# Patient Record
Sex: Female | Born: 1960 | Race: White | Hispanic: No | Marital: Married | Smoking: Never smoker
Health system: Southern US, Community
[De-identification: ages and names within clinical notes are randomized; demographics above are authoritative.]

## PROBLEM LIST (undated history)

## (undated) DIAGNOSIS — Z9889 Other specified postprocedural states: Secondary | ICD-10-CM

## (undated) DIAGNOSIS — F329 Major depressive disorder, single episode, unspecified: Secondary | ICD-10-CM

## (undated) DIAGNOSIS — E7800 Pure hypercholesterolemia, unspecified: Secondary | ICD-10-CM

## (undated) DIAGNOSIS — K219 Gastro-esophageal reflux disease without esophagitis: Secondary | ICD-10-CM

## (undated) DIAGNOSIS — R51 Headache: Secondary | ICD-10-CM

## (undated) DIAGNOSIS — E039 Hypothyroidism, unspecified: Secondary | ICD-10-CM

## (undated) DIAGNOSIS — F32A Depression, unspecified: Secondary | ICD-10-CM

## (undated) DIAGNOSIS — D649 Anemia, unspecified: Secondary | ICD-10-CM

## (undated) DIAGNOSIS — F419 Anxiety disorder, unspecified: Secondary | ICD-10-CM

## (undated) DIAGNOSIS — R112 Nausea with vomiting, unspecified: Secondary | ICD-10-CM

## (undated) HISTORY — PX: COLONOSCOPY: SHX174

## (undated) SURGERY — HYSTERECTOMY, ABDOMINAL
Anesthesia: General

---

## 1996-01-02 HISTORY — PX: OTHER SURGICAL HISTORY: SHX169

## 1997-05-23 ENCOUNTER — Other Ambulatory Visit: Admission: RE | Admit: 1997-05-23 | Discharge: 1997-05-23 | Payer: Self-pay | Admitting: Surgery

## 1997-06-14 ENCOUNTER — Ambulatory Visit (HOSPITAL_BASED_OUTPATIENT_CLINIC_OR_DEPARTMENT_OTHER): Admission: RE | Admit: 1997-06-14 | Discharge: 1997-06-14 | Payer: Self-pay | Admitting: Surgery

## 2005-01-01 HISTORY — PX: HEMORROIDECTOMY: SUR656

## 2005-03-19 ENCOUNTER — Ambulatory Visit (HOSPITAL_BASED_OUTPATIENT_CLINIC_OR_DEPARTMENT_OTHER): Admission: RE | Admit: 2005-03-19 | Discharge: 2005-03-19 | Payer: Self-pay | Admitting: Surgery

## 2010-07-13 ENCOUNTER — Other Ambulatory Visit (HOSPITAL_COMMUNITY)
Admission: RE | Admit: 2010-07-13 | Discharge: 2010-07-13 | Disposition: A | Discharge status: Home / Self Care | Payer: BC Managed Care – PPO | Source: Ambulatory Visit | Attending: Obstetrics and Gynecology | Admitting: Obstetrics and Gynecology

## 2010-07-13 ENCOUNTER — Other Ambulatory Visit: Payer: Self-pay | Admitting: Obstetrics and Gynecology

## 2010-07-13 DIAGNOSIS — Z1159 Encounter for screening for other viral diseases: Secondary | ICD-10-CM | POA: Insufficient documentation

## 2010-07-13 DIAGNOSIS — Z01419 Encounter for gynecological examination (general) (routine) without abnormal findings: Secondary | ICD-10-CM | POA: Insufficient documentation

## 2010-09-13 ENCOUNTER — Encounter (HOSPITAL_COMMUNITY): Payer: Self-pay

## 2010-09-13 ENCOUNTER — Encounter (HOSPITAL_COMMUNITY)
Admission: RE | Admit: 2010-09-13 | Discharge: 2010-09-13 | Disposition: A | Discharge status: Home / Self Care | Payer: BC Managed Care – PPO | Source: Ambulatory Visit | Attending: Obstetrics and Gynecology | Admitting: Obstetrics and Gynecology

## 2010-09-13 HISTORY — DX: Major depressive disorder, single episode, unspecified: F32.9

## 2010-09-13 HISTORY — DX: Hypothyroidism, unspecified: E03.9

## 2010-09-13 HISTORY — DX: Pure hypercholesterolemia, unspecified: E78.00

## 2010-09-13 HISTORY — DX: Gastro-esophageal reflux disease without esophagitis: K21.9

## 2010-09-13 HISTORY — DX: Anemia, unspecified: D64.9

## 2010-09-13 HISTORY — DX: Depression, unspecified: F32.A

## 2010-09-13 HISTORY — DX: Other specified postprocedural states: Z98.890

## 2010-09-13 HISTORY — DX: Anxiety disorder, unspecified: F41.9

## 2010-09-13 HISTORY — DX: Headache: R51

## 2010-09-13 HISTORY — DX: Nausea with vomiting, unspecified: R11.2

## 2010-09-13 LAB — CBC
HCT: 39.3 % (ref 36.0–46.0)
MCHC: 33.8 g/dL (ref 30.0–36.0)
Platelets: 348 10*3/uL (ref 150–400)
RDW: 14.2 % (ref 11.5–15.5)
WBC: 6.1 10*3/uL (ref 4.0–10.5)

## 2010-09-13 LAB — SURGICAL PCR SCREEN: Staphylococcus aureus: POSITIVE — AB

## 2010-09-13 NOTE — Pre-Procedure Instructions (Signed)
Anesthesia will see dos-update given to Dr F Jackson-LSD ok 

## 2010-09-13 NOTE — Patient Instructions (Addendum)
   Your procedure is scheduled WU:[id number] 09/18/2010  Enter through the Main Entrance of [facility] at 6am Pick up the phone at the desk and dial [phone]  Please call this number if you have any problems the morning of surgery: [phone]  Remember: Do not eat food after midnight  Do not drink clear liquids after:midnight Take these medicines the morning of surgery with a SIP OF WATER:prilosec, wellbutrin and zoloft  Do not wear jewelry, make-up, or FINGER nail polish Do not wear lotions, powders, or perfumes. Do not shave 48 hours prior to surgery. Do not bring valuables to the hospital. Leave suitcase in the car. After Surgery it may be brought to your room. For patients being admitted to the hospital, checkout time is 11:00am the day of discharge.  Use hibiclens as instructed

## 2010-09-17 ENCOUNTER — Other Ambulatory Visit: Payer: Self-pay | Admitting: Obstetrics and Gynecology

## 2010-09-17 NOTE — H&P (Signed)
Mikayla Wheeler is an 50 y.o. female.G0 with worsening menorrhagia, dysmenorrhea due to fibroids.  [place] confirms multiple firbroids with growth and a left ovarian mass consistent with endometriosis. Pt requests TAHBSO.  Informed consent given for these procedures.  Pertinent Gynecological History: Menses: flow is excessive with use of multiple pads or tampons on heaviest days Bleeding: clottling present Contraception: none DES exposure: denies Blood transfusions: none Sexually transmitted diseases: no past history Previous GYN Procedures: endometrial biopsy negative less than a year.  Last mammogram: normal Date: 07/14/10 Last pap: normal Date: 07/13/10 OB History: G0, P0   Menstrual History: Menarche age: na Patient's last menstrual period was 08/11/2010.    Past Medical History  Diagnosis Date  . PONV (postoperative nausea and vomiting)   . Hypercholesteremia     simvastatin  . Depression   . Anxiety   . Anemia     iron  . Hypothyroidism     synthroid  . GERD (gastroesophageal reflux disease)     instructed to take prilosec dos  . Headache     Past Surgical History  Procedure Date  . Right breast lumpectomy 1998  . Hemorroidectomy 2007    No family history on file.  Social History:  reports that she has never smoked. She does not have any smokeless tobacco history on file. She reports that she drinks alcohol. She reports that she does not use illicit drugs.  Allergies:  Allergies  Allergen Reactions  . Latex Rash     (Not in a hospital admission)  Review of Systems  Constitutional: Negative.  Negative for fever, chills, weight loss and malaise/fatigue.  HENT: Negative.   Eyes: Negative.   Respiratory: Negative.   Cardiovascular: Negative.   Gastrointestinal: Negative.   Genitourinary: Negative.   Musculoskeletal: Negative.   Skin: Negative.   Neurological: Negative.   Endo/Heme/Allergies: Negative.   Psychiatric/Behavioral: Negative.     Last  menstrual period 08/11/2010. Physical Exam  Constitutional: She is oriented to person, place, and time. She appears well-developed and well-nourished. No distress.  HENT:  Head: Normocephalic.  Eyes: Pupils are equal, round, and reactive to light.  Neck: Neck supple. No tracheal deviation present. No thyromegaly present.  Cardiovascular: Normal rate, regular rhythm, normal heart sounds and intact distal pulses.  Exam reveals no gallop and no friction rub.   No murmur heard. Respiratory: Effort normal and breath sounds normal.  GI: Bowel sounds are normal. There is no tenderness. There is no rebound.  Genitourinary: Vagina normal.  Musculoskeletal: Normal range of motion.  Neurological: She is alert and oriented to person, place, and time.  Skin: Skin is warm. No rash noted. No erythema.  Psychiatric: Her behavior is normal.    No results found for this or any previous visit (from the past 24 hour(s)).  No results found.  Assessment/Plan: Menorragia, dysmenorrhea, fibroids, L ovarian mass P:  TAHBSO  Cacie Gaskins E 09/17/2010, 7:21 PM

## 2010-09-18 ENCOUNTER — Inpatient Hospital Stay (HOSPITAL_COMMUNITY)
Admission: RE | Admit: 2010-09-18 | Discharge: 2010-09-20 | DRG: 359 | Disposition: A | Discharge status: Home / Self Care | Payer: BC Managed Care – PPO | Source: Ambulatory Visit | Attending: Obstetrics and Gynecology | Admitting: Obstetrics and Gynecology

## 2010-09-18 ENCOUNTER — Encounter (HOSPITAL_COMMUNITY): Payer: Self-pay | Admitting: *Deleted

## 2010-09-18 ENCOUNTER — Encounter (HOSPITAL_COMMUNITY): Payer: Self-pay | Admitting: Anesthesiology

## 2010-09-18 ENCOUNTER — Inpatient Hospital Stay (HOSPITAL_COMMUNITY): Payer: BC Managed Care – PPO | Admitting: Anesthesiology

## 2010-09-18 ENCOUNTER — Encounter (HOSPITAL_COMMUNITY)
Admission: RE | Disposition: A | Discharge status: Home / Self Care | Payer: Self-pay | Source: Ambulatory Visit | Attending: Obstetrics and Gynecology

## 2010-09-18 ENCOUNTER — Encounter (HOSPITAL_COMMUNITY): Payer: Self-pay | Admitting: Obstetrics and Gynecology

## 2010-09-18 ENCOUNTER — Other Ambulatory Visit: Payer: Self-pay | Admitting: Obstetrics and Gynecology

## 2010-09-18 DIAGNOSIS — D250 Submucous leiomyoma of uterus: Secondary | ICD-10-CM

## 2010-09-18 DIAGNOSIS — N920 Excessive and frequent menstruation with regular cycle: Secondary | ICD-10-CM

## 2010-09-18 DIAGNOSIS — D251 Intramural leiomyoma of uterus: Secondary | ICD-10-CM | POA: Diagnosis present

## 2010-09-18 DIAGNOSIS — N801 Endometriosis of ovary: Secondary | ICD-10-CM | POA: Diagnosis present

## 2010-09-18 DIAGNOSIS — N946 Dysmenorrhea, unspecified: Secondary | ICD-10-CM | POA: Diagnosis present

## 2010-09-18 DIAGNOSIS — N840 Polyp of corpus uteri: Secondary | ICD-10-CM | POA: Diagnosis present

## 2010-09-18 DIAGNOSIS — N80109 Endometriosis of ovary, unspecified side, unspecified depth: Secondary | ICD-10-CM | POA: Diagnosis present

## 2010-09-18 DIAGNOSIS — Z90710 Acquired absence of both cervix and uterus: Secondary | ICD-10-CM

## 2010-09-18 DIAGNOSIS — D252 Subserosal leiomyoma of uterus: Secondary | ICD-10-CM | POA: Diagnosis present

## 2010-09-18 HISTORY — PX: ABDOMINAL HYSTERECTOMY: SHX81

## 2010-09-18 LAB — DIFFERENTIAL
Lymphs Abs: 1.3 10*3/uL (ref 0.7–4.0)
Monocytes Relative: 6 % (ref 3–12)
Neutro Abs: 3.8 10*3/uL (ref 1.7–7.7)
Neutrophils Relative %: 67 % (ref 43–77)

## 2010-09-18 LAB — CBC
Hemoglobin: 12.1 g/dL (ref 12.0–15.0)
Platelets: 345 10*3/uL (ref 150–400)
RBC: 3.95 MIL/uL (ref 3.87–5.11)
WBC: 5.8 10*3/uL (ref 4.0–10.5)

## 2010-09-18 LAB — SAMPLE TO BLOOD BANK

## 2010-09-18 SURGERY — HYSTERECTOMY, ABDOMINAL
Anesthesia: General | Laterality: Bilateral | Wound class: Clean Contaminated

## 2010-09-18 MED ORDER — LIDOCAINE HCL (CARDIAC) 20 MG/ML IV SOLN
INTRAVENOUS | Status: AC
  Filled 2010-09-18: qty 5

## 2010-09-18 MED ORDER — IBUPROFEN 600 MG PO TABS
600.0000 mg | ORAL_TABLET | Freq: Four times a day (QID) | ORAL | Status: DC | PRN
  Administered 2010-09-19 – 2010-09-20 (×3): 600 mg via ORAL
  Filled 2010-09-18 (×3): qty 1

## 2010-09-18 MED ORDER — SODIUM CHLORIDE 0.9 % IJ SOLN: 9.0000 mL | INTRAMUSCULAR | Status: DC | PRN

## 2010-09-18 MED ORDER — HYDROMORPHONE HCL 1 MG/ML IJ SOLN
INTRAMUSCULAR | Status: DC | PRN
  Administered 2010-09-18 (×2): 1 mg via INTRAVENOUS

## 2010-09-18 MED ORDER — SODIUM CHLORIDE 0.9 % IR SOLN
Status: DC | PRN
  Administered 2010-09-18: 1000 mL

## 2010-09-18 MED ORDER — PROPOFOL 10 MG/ML IV EMUL
INTRAVENOUS | Status: AC
  Filled 2010-09-18: qty 20

## 2010-09-18 MED ORDER — CEFAZOLIN SODIUM 1-5 GM-% IV SOLN
1.0000 g | INTRAVENOUS | Status: AC
  Administered 2010-09-18: 1 g via INTRAVENOUS

## 2010-09-18 MED ORDER — BUPIVACAINE ON-Q PAIN PUMP (FOR ORDER SET NO CHG)
INJECTION | Status: DC
  Filled 2010-09-18: qty 1

## 2010-09-18 MED ORDER — FENTANYL CITRATE 0.05 MG/ML IJ SOLN
INTRAMUSCULAR | Status: DC | PRN
  Administered 2010-09-18: 50 ug via INTRAVENOUS
  Administered 2010-09-18 (×3): 100 ug via INTRAVENOUS
  Administered 2010-09-18: 50 ug via INTRAVENOUS
  Administered 2010-09-18: 100 ug via INTRAVENOUS

## 2010-09-18 MED ORDER — FENTANYL CITRATE 0.05 MG/ML IJ SOLN
INTRAMUSCULAR | Status: AC
  Filled 2010-09-18: qty 5

## 2010-09-18 MED ORDER — MAGNESIUM CITRATE PO SOLN
296.0000 mL | Freq: Every day | ORAL | Status: DC | PRN
  Filled 2010-09-18: qty 296

## 2010-09-18 MED ORDER — TEMAZEPAM 15 MG PO CAPS
15.0000 mg | ORAL_CAPSULE | Freq: Every evening | ORAL | Status: DC | PRN
  Administered 2010-09-19: 30 mg via ORAL
  Filled 2010-09-18: qty 2

## 2010-09-18 MED ORDER — HYDROMORPHONE HCL 1 MG/ML IJ SOLN
INTRAMUSCULAR | Status: AC
  Filled 2010-09-18: qty 1

## 2010-09-18 MED ORDER — HYDROMORPHONE 0.3 MG/ML IV SOLN
INTRAVENOUS | Status: DC
  Administered 2010-09-18: 1.59 mg via INTRAVENOUS
  Administered 2010-09-18: 1.9 mg via INTRAVENOUS
  Administered 2010-09-18: 14:00:00 via INTRAVENOUS
  Administered 2010-09-19: 1.79 mg via INTRAVENOUS
  Administered 2010-09-19: 05:00:00 via INTRAVENOUS
  Administered 2010-09-19: 1.2 mg via INTRAVENOUS
  Administered 2010-09-19: 0.999 mg via INTRAVENOUS

## 2010-09-18 MED ORDER — ONDANSETRON HCL 4 MG/2ML IJ SOLN
INTRAMUSCULAR | Status: AC
  Filled 2010-09-18: qty 2

## 2010-09-18 MED ORDER — SIMETHICONE 80 MG PO CHEW: 80.0000 mg | CHEWABLE_TABLET | Freq: Four times a day (QID) | ORAL | Status: DC | PRN

## 2010-09-18 MED ORDER — GLYCOPYRROLATE 0.2 MG/ML IJ SOLN
INTRAMUSCULAR | Status: DC | PRN
  Administered 2010-09-18: 0.2 mg via INTRAVENOUS
  Administered 2010-09-18: .6 mg via INTRAVENOUS

## 2010-09-18 MED ORDER — MENTHOL 3 MG MT LOZG
1.0000 | LOZENGE | OROMUCOSAL | Status: DC | PRN
  Filled 2010-09-18: qty 9

## 2010-09-18 MED ORDER — CEFAZOLIN SODIUM 1-5 GM-% IV SOLN
INTRAVENOUS | Status: AC
  Filled 2010-09-18: qty 50

## 2010-09-18 MED ORDER — MIDAZOLAM HCL 2 MG/2ML IJ SOLN
INTRAMUSCULAR | Status: AC
  Filled 2010-09-18: qty 2

## 2010-09-18 MED ORDER — ROCURONIUM BROMIDE 50 MG/5ML IV SOLN
INTRAVENOUS | Status: AC
  Filled 2010-09-18: qty 1

## 2010-09-18 MED ORDER — LIDOCAINE HCL (CARDIAC) 20 MG/ML IV SOLN
INTRAVENOUS | Status: DC | PRN
  Administered 2010-09-18: 80 mg via INTRAVENOUS
  Administered 2010-09-18: 20 mg via INTRAVENOUS

## 2010-09-18 MED ORDER — NEOSTIGMINE METHYLSULFATE 1 MG/ML IJ SOLN
INTRAMUSCULAR | Status: DC | PRN
  Administered 2010-09-18: 3 mg via INTRAMUSCULAR

## 2010-09-18 MED ORDER — INDIGOTINDISULFONATE SODIUM 8 MG/ML IJ SOLN
INTRAMUSCULAR | Status: DC | PRN
  Administered 2010-09-18: 5 mL via INTRAVENOUS

## 2010-09-18 MED ORDER — NALOXONE HCL 0.4 MG/ML IJ SOLN: 0.4000 mg | INTRAMUSCULAR | Status: DC | PRN

## 2010-09-18 MED ORDER — LACTATED RINGERS IV SOLN
INTRAVENOUS | Status: DC
  Administered 2010-09-18 (×5): via INTRAVENOUS

## 2010-09-18 MED ORDER — HYDROMORPHONE HCL 1 MG/ML IJ SOLN
0.2500 mg | INTRAMUSCULAR | Status: DC | PRN
  Administered 2010-09-18: 0.5 mg via INTRAVENOUS

## 2010-09-18 MED ORDER — ONDANSETRON HCL 4 MG/2ML IJ SOLN: 4.0000 mg | Freq: Four times a day (QID) | INTRAMUSCULAR | Status: DC | PRN

## 2010-09-18 MED ORDER — KETOROLAC TROMETHAMINE 30 MG/ML IJ SOLN
INTRAMUSCULAR | Status: DC | PRN
  Administered 2010-09-18: 60 mg via INTRAVENOUS

## 2010-09-18 MED ORDER — MIDAZOLAM HCL 5 MG/5ML IJ SOLN
INTRAMUSCULAR | Status: DC | PRN
  Administered 2010-09-18: 2 mg via INTRAVENOUS

## 2010-09-18 MED ORDER — SCOPOLAMINE 1 MG/3DAYS TD PT72
1.0000 | MEDICATED_PATCH | TRANSDERMAL | Status: DC
  Administered 2010-09-18: 1.5 mg via TRANSDERMAL

## 2010-09-18 MED ORDER — GLYCOPYRROLATE 0.2 MG/ML IJ SOLN
INTRAMUSCULAR | Status: AC
  Filled 2010-09-18: qty 1

## 2010-09-18 MED ORDER — SENNOSIDES-DOCUSATE SODIUM 8.6-50 MG PO TABS: 2.0000 | ORAL_TABLET | Freq: Every day | ORAL | Status: DC | PRN

## 2010-09-18 MED ORDER — ONDANSETRON HCL 4 MG/2ML IJ SOLN
INTRAMUSCULAR | Status: DC | PRN
  Administered 2010-09-18: 4 mg via INTRAVENOUS

## 2010-09-18 MED ORDER — DIPHENHYDRAMINE HCL 12.5 MG/5ML PO ELIX
12.5000 mg | ORAL_SOLUTION | Freq: Four times a day (QID) | ORAL | Status: DC | PRN
  Filled 2010-09-18: qty 5

## 2010-09-18 MED ORDER — ROCURONIUM BROMIDE 100 MG/10ML IV SOLN
INTRAVENOUS | Status: DC | PRN
  Administered 2010-09-18 (×2): 10 mg via INTRAVENOUS
  Administered 2010-09-18: 35 mg via INTRAVENOUS
  Administered 2010-09-18: 10 mg via INTRAVENOUS
  Administered 2010-09-18: 5 mg via INTRAVENOUS

## 2010-09-18 MED ORDER — PROPOFOL 10 MG/ML IV EMUL
INTRAVENOUS | Status: DC | PRN
  Administered 2010-09-18: 180 mg via INTRAVENOUS

## 2010-09-18 MED ORDER — LACTATED RINGERS IV SOLN
INTRAVENOUS | Status: DC
  Administered 2010-09-18 – 2010-09-19 (×3): via INTRAVENOUS

## 2010-09-18 MED ORDER — DEXAMETHASONE SODIUM PHOSPHATE 4 MG/ML IJ SOLN
INTRAMUSCULAR | Status: DC | PRN
  Administered 2010-09-18: 10 mg via INTRAVENOUS

## 2010-09-18 MED ORDER — BISACODYL 10 MG RE SUPP
10.0000 mg | Freq: Every day | RECTAL | Status: DC | PRN
  Filled 2010-09-18: qty 1

## 2010-09-18 MED ORDER — KETOROLAC TROMETHAMINE 60 MG/2ML IM SOLN
INTRAMUSCULAR | Status: AC
  Filled 2010-09-18: qty 2

## 2010-09-18 MED ORDER — HYDROMORPHONE 0.3 MG/ML IV SOLN
INTRAVENOUS | Status: AC
  Filled 2010-09-18: qty 25

## 2010-09-18 MED ORDER — DIPHENHYDRAMINE HCL 50 MG/ML IJ SOLN: 12.5000 mg | Freq: Four times a day (QID) | INTRAMUSCULAR | Status: DC | PRN

## 2010-09-18 MED ORDER — SCOPOLAMINE 1 MG/3DAYS TD PT72
MEDICATED_PATCH | TRANSDERMAL | Status: AC
  Administered 2010-09-18: 1.5 mg via TRANSDERMAL
  Filled 2010-09-18: qty 1

## 2010-09-18 MED ORDER — OXYCODONE-ACETAMINOPHEN 5-325 MG PO TABS
1.0000 | ORAL_TABLET | ORAL | Status: DC | PRN
  Administered 2010-09-19: 1 via ORAL
  Administered 2010-09-19: 2 via ORAL
  Administered 2010-09-20 (×2): 1 via ORAL
  Filled 2010-09-18: qty 2
  Filled 2010-09-18 (×3): qty 1

## 2010-09-18 MED ORDER — NEOSTIGMINE METHYLSULFATE 1 MG/ML IJ SOLN
INTRAMUSCULAR | Status: AC
  Filled 2010-09-18: qty 10

## 2010-09-18 SURGICAL SUPPLY — 32 items
CANISTER SUCTION 2500CC (MISCELLANEOUS) ×2 IMPLANT
CHLORAPREP W/TINT 26ML (MISCELLANEOUS) ×2 IMPLANT
CLOTH BEACON ORANGE TIMEOUT ST (SAFETY) ×2 IMPLANT
CONT PATH 16OZ SNAP LID 3702 (MISCELLANEOUS) ×2 IMPLANT
DISSECTOR SPONGE CHERRY (GAUZE/BANDAGES/DRESSINGS) IMPLANT
DRAPE UTILITY XL STRL (DRAPES) ×2 IMPLANT
DRESSING TELFA 8X3 (GAUZE/BANDAGES/DRESSINGS) ×1 IMPLANT
ELECT BLADE 6 FLAT ULTRCLN (ELECTRODE) ×1 IMPLANT
GAUZE SPONGE 4X4 16PLY XRAY LF (GAUZE/BANDAGES/DRESSINGS) ×4 IMPLANT
GLOVE BIO SURGEON STRL SZ7 (GLOVE) ×2 IMPLANT
GLOVE BIOGEL PI IND STRL 7.0 (GLOVE) ×2 IMPLANT
GLOVE BIOGEL PI INDICATOR 7.0 (GLOVE) ×2
GOWN PREVENTION PLUS LG XLONG (DISPOSABLE) ×4 IMPLANT
GOWN PREVENTION PLUS XLARGE (GOWN DISPOSABLE) ×2 IMPLANT
HEMOSTAT SURGICEL 2X14 (HEMOSTASIS) ×1 IMPLANT
NS IRRIG 1000ML POUR BTL (IV SOLUTION) ×2 IMPLANT
PACK ABDOMINAL GYN (CUSTOM PROCEDURE TRAY) ×2 IMPLANT
PAD ABD 5X9 TENDERSORB (GAUZE/BANDAGES/DRESSINGS) ×1 IMPLANT
PAD OB MATERNITY 4.3X12.25 (PERSONAL CARE ITEMS) ×2 IMPLANT
SPONGE LAP 18X18 X RAY DECT (DISPOSABLE) ×4 IMPLANT
STAPLER VISISTAT 35W (STAPLE) ×2 IMPLANT
SUT PLAIN 2 0 XLH (SUTURE) IMPLANT
SUT VIC AB 0 CT1 18XCR BRD8 (SUTURE) ×4 IMPLANT
SUT VIC AB 0 CT1 36 (SUTURE) ×4 IMPLANT
SUT VIC AB 0 CT1 8-18 (SUTURE) ×10
SUT VIC AB 2-0 CT1 (SUTURE) ×4 IMPLANT
SUT VIC AB 4-0 KS 27 (SUTURE) IMPLANT
SUT VICRYL 0 TIES 12 18 (SUTURE) ×2 IMPLANT
TAPE CLOTH SURG 4X10 WHT LF (GAUZE/BANDAGES/DRESSINGS) ×1 IMPLANT
TOWEL OR 17X24 6PK STRL BLUE (TOWEL DISPOSABLE) ×4 IMPLANT
TRAY FOLEY CATH 14FR (SET/KITS/TRAYS/PACK) ×2 IMPLANT
WATER STERILE IRR 1000ML POUR (IV SOLUTION) ×2 IMPLANT

## 2010-09-18 NOTE — Preoperative (Addendum)
Beta Blockers   Reason not to administer Beta Blockers:Not Applicable 

## 2010-09-18 NOTE — Anesthesia Preprocedure Evaluation (Signed)
Anesthesia Evaluation  Name, MR# and DOB Patient awake  General Assessment Comment  Reviewed: Allergy & Precautions, H&P , NPO status , Patient's Chart, lab work & pertinent test results, reviewed documented beta blocker date and time   Airway Mallampati: II TM Distance: <3 FB Neck ROM: full    Dental  (+) Teeth Intact   Pulmonary  clear to auscultation  pulmonary exam normalPulmonary Exam Normal breath sounds clear to auscultation none    Cardiovascular     Neuro/Psych   GI/Hepatic/Renal   negative Liver ROS  negative Renal ROS   GERD Medicated     Endo/Other  (+) Hypothyroidism,   Morbid obesity  Abdominal Normal abdominal exam  (+)   Musculoskeletal   Hematology negative hematology ROS (+)   Peds  Reproductive/Obstetrics negative OB ROS    Anesthesia Other Findings             Anesthesia Physical Anesthesia Plan  ASA: II  Anesthesia Plan: General   Post-op Pain Management:    Induction: Intravenous  Airway Management Planned: Oral ETT  Additional Equipment:   Intra-op Plan:   Post-operative Plan: Extubation in OR  Informed Consent: I have reviewed the patients History and Physical, chart, labs and discussed the procedure including the risks, benefits and alternatives for the proposed anesthesia with the patient or authorized representative who has indicated his/her understanding and acceptance.   Dental Advisory Given  Plan Discussed with: CRNA  Anesthesia Plan Comments:         Anesthesia Quick Evaluation

## 2010-09-18 NOTE — Transfer of Care (Signed)
Immediate Anesthesia Transfer of Care Note  Patient: Mikayla Wheeler  Procedure(s) Performed:  HYSTERECTOMY ABDOMINAL - Bilateral Salpingo Oophorectomy; with Lysis of Adhesions  Patient Location: PACU  Anesthesia Type: General  Level of Consciousness: awake, alert  and oriented  Airway & Oxygen Therapy: Patient Spontanous Breathing and Patient connected to nasal cannula oxygen  Post-op Assessment: Report given to PACU RN and Post -op Vital signs reviewed and stable  Post vital signs: Reviewed and stable  Complications: No apparent anesthesia complications

## 2010-09-18 NOTE — Transfer of Care (Signed)
Patient care transferred to relief CRNA

## 2010-09-19 LAB — CBC
MCH: 31.2 pg (ref 26.0–34.0)
MCV: 93.4 fL (ref 78.0–100.0)
Platelets: 318 10*3/uL (ref 150–400)
RDW: 14.1 % (ref 11.5–15.5)

## 2010-09-19 MED ORDER — BUPROPION HCL ER (XL) 150 MG PO TB24
150.0000 mg | ORAL_TABLET | Freq: Every day | ORAL | Status: DC
  Administered 2010-09-20: 150 mg via ORAL
  Filled 2010-09-19 (×3): qty 1

## 2010-09-19 MED ORDER — PANTOPRAZOLE SODIUM 40 MG PO TBEC
40.0000 mg | DELAYED_RELEASE_TABLET | Freq: Two times a day (BID) | ORAL | Status: DC
  Administered 2010-09-20 (×2): 40 mg via ORAL
  Filled 2010-09-19 (×4): qty 1

## 2010-09-19 MED ORDER — DIPHENHYDRAMINE HCL 25 MG PO CAPS: 25.0000 mg | ORAL_CAPSULE | Freq: Four times a day (QID) | ORAL | Status: DC | PRN

## 2010-09-19 MED ORDER — LEVOTHYROXINE SODIUM 125 MCG PO TABS
250.0000 ug | ORAL_TABLET | Freq: Every day | ORAL | Status: DC
  Administered 2010-09-20: 250 ug via ORAL
  Filled 2010-09-19 (×3): qty 2

## 2010-09-19 MED ORDER — SIMVASTATIN 40 MG PO TABS
40.0000 mg | ORAL_TABLET | Freq: Every day | ORAL | Status: DC
  Administered 2010-09-19: 40 mg via ORAL
  Filled 2010-09-19 (×2): qty 1

## 2010-09-19 MED ORDER — HYDROMORPHONE 0.3 MG/ML IV SOLN
INTRAVENOUS | Status: AC
  Filled 2010-09-19: qty 25

## 2010-09-19 MED ORDER — FERROUS SULFATE 325 (65 FE) MG PO TABS
325.0000 mg | ORAL_TABLET | Freq: Three times a day (TID) | ORAL | Status: DC
  Administered 2010-09-20 (×3): 325 mg via ORAL
  Filled 2010-09-19 (×4): qty 1

## 2010-09-19 MED ORDER — SERTRALINE HCL 100 MG PO TABS
100.0000 mg | ORAL_TABLET | Freq: Every day | ORAL | Status: DC
  Administered 2010-09-20: 100 mg via ORAL
  Filled 2010-09-19 (×3): qty 1

## 2010-09-19 NOTE — Anesthesia Postprocedure Evaluation (Signed)
Anesthesia Post Note  Patient: Mikayla Wheeler  Procedure(s) Performed:  HYSTERECTOMY ABDOMINAL - Bilateral Salpingo Oophorectomy; with Lysis of Adhesions  Anesthesia type: General  Patient location: PACU  Post pain: Pain level controlled  Post assessment: Post-op Vital signs reviewed  Last Vitals:  Filed Vitals:   09/19/10 1400  BP: 132/73  Pulse: 79  Temp: 98.1 F (36.7 C)  Resp: 18    Post vital signs: Reviewed  Level of consciousness: sedated  Complications: No apparent anesthesia complicationsfj

## 2010-09-19 NOTE — Progress Notes (Signed)
1 Day Post-Op Procedure(s) (LRB): HYSTERECTOMY ABDOMINAL (Bilateral)  Subjective: Patient reports: itching  Objective: I have reviewed patient's vital signs, intake and output, medications and labs.  General: alert and no distress Resp: clear to auscultation bilaterally Cardio: S1, S2 normal GI: soft, non-tender; bowel sounds normal; no masses,  no organomegaly Extremities: extremities normal, atraumatic, no cyanosis or edema Vaginal Bleeding: minimal Incision nl  Assessment: s/p Procedure(s): HYSTERECTOMY ABDOMINAL: stable Itching  Plan: DC pain med pump Advance diet DC IV, DC foley, dressing Benadryl prn Ambulate  LOS: 1 day    Jossalin Chervenak E 09/19/2010, 12:34 PM

## 2010-09-19 NOTE — Anesthesia Postprocedure Evaluation (Signed)
  Anesthesia Post-op Note  Patient: Mikayla Wheeler  Procedure(s) Performed:  HYSTERECTOMY ABDOMINAL - Bilateral Salpingo Oophorectomy; with Lysis of Adhesions  Patient Location: [facility]  Anesthesia Type: General  Level of Consciousness: awake, alert  and oriented  Airway and Oxygen Therapy: Patient Spontanous Breathing  Post-op Pain: mild  Post-op Assessment: Post-op Vital signs reviewed and Patient's Cardiovascular Status Stable  Post-op Vital Signs: Reviewed and stable  Complications: No apparent anesthesia complications

## 2010-09-19 NOTE — Op Note (Signed)
NAMEYUETTE, PUTNAM NO.:  [id number]  MEDICAL RECORD NO.:  [id number]  LOCATION:  9305                          FACILITY:  [facility]  PHYSICIAN:  Arlyce Harman, MD     DATE OF BIRTH:  25-Apr-1960  DATE OF PROCEDURE:  09/18/2010 DATE OF DISCHARGE:                              OPERATIVE REPORT   PREOPERATIVE DIAGNOSES:  Menorrhagia, dysmenorrhea, enlarging fibroids, and left ovarian endometrioma.  POSTOPERATIVE DIAGNOSES:  Menorrhagia, dysmenorrhea, enlarging fibroids, and left ovarian endometrioma.  SURGEON:  Arlyce Harman, MD  ASSISTANT:  Dr. Tinnie Gens.  ESTIMATED BLOOD LOSS:  450 mL.  COMPLICATIONS:  None.  INDICATIONS AND CONSENT:  The patient is a 50 year old nulliparous female with enlarging fibroids, menorrhagia, dysmenorrhea.  Ultrasound has confirmed fibroids and a mass on the left ovary consistent with endometrioma.  Options for therapy has been discussed in detail with the patient and she has requested hysterectomy.  Risks, possible complications, have been discussed and informed consent was given.  PROCEDURE:  The patient was placed on the table in a supine position and general anesthesia was induced.  The abdomen was sterilely prepped and draped in the usual fashion, and a Pfannenstiel incision was made into the skin and extended through the abdominal layers without difficulty. The peritoneum was entered, and the uterus was examined and had multiple fibroid tumors.  The left ovary could not be easily visualized due to adhesions to the back of the pelvic sidewall.  We initiated our procedure by packing the bowel away after we had placed an Ambrose [place].  A stay suture was placed on the top of the uterine fundus for traction and a long Tresa Endo was placed along the margins of the uterus.  The round ligament was clamped, cut, and tied all ties were with 1-0 Vicryl suture unless indicated otherwise.  The anterior leaf of broad  ligament was entered and the incision was extended down across the bladder.  The posterior leaf of the broad ligament was entered and the infundibulopelvic ligament was isolated and doubly tied.  We directed our attention to the left side again the ovary could not be easily visualized, so it was determined that we would go ahead with hysterectomy and come back and give attention to the ovary, so the round ligament on the left side was clamped, cut, and tied, and we entered the posterior leaf of the broad ligament and carefully isolated the infundibulopelvic ligament after the uterine vessels were clamped, cut and tied bilaterally.  We continued to work the bladder down and extended our incision down, and we reached to the cervix then a curved Haney was placed on the pedicle, and the pedicle included the cervicovaginal junction.  This was excised bilaterally, and the uterus and cervix and right tube and ovary were handed off without difficulty. At this point, we directed our attention to the ovary and using sharp and blunt dissection with the scissors we were able to free up the ovary and put a clamp on the mesentery and the broad ligament.  At this point, curved Haney were placed on the cuff in the cuff was incised and the uterus and cervix and the left tube and ovary  were handed off. Richardson angle stitches were placed bilaterally and the cuff was run 1- 0 in a running locked fashion.  After the infundibulopelvic ligament had been isolated and clamped, cut and tied the left ovary began oozing some dark fluid consistent with endometrioma formation, and we sharply and bluntly broached carefully close to the pedicle and were able to remove the ovary.  The uterus was irrigated and sponged out, hemostasis was excellent.  Therefore, the peritoneum was closed with 2-0 chromic in a running fashion.  The fascia was closed with 0 Vicryl in a running locked fashion.  After the uterus and cervix  had been removed, and the Richardson angle stitches had been placed to close the cuff was closed with 0 Vicryl in a running locked fashion.  The pelvis was then irrigated and suctioned out.  Hemostasis was excellent.  The peritoneum was closed with 2-0 chromic in a running fashion.  The fascia was closed with 0 Vicryl in a running locked fashion.  The subcu was closed with 2- 0 plain in a running fashion, and the skin was closed with 4-0 Vicryl in a subcuticular fashion.  SPECIMENS:  Uterus and cervix and bilateral ovaries.  DISPOSITION:  Sent to Pathology.  Weight of the uterus was 475 grams.     Arlyce Harman, MD     EG/MEDQ  D:  09/18/2010  T:  09/19/2010  Job:  [id number]

## 2010-09-20 MED ORDER — OXYCODONE-ACETAMINOPHEN 5-325 MG PO TABS: 1.0000 | ORAL_TABLET | ORAL | Status: AC | PRN

## 2010-09-20 MED ORDER — IBUPROFEN 600 MG PO TABS: 600.0000 mg | ORAL_TABLET | Freq: Four times a day (QID) | ORAL | Status: AC | PRN

## 2010-09-20 NOTE — Progress Notes (Signed)
Subjective: Patient reports tolerating PO, + flatus and no problems voiding.    Objective: I have reviewed patient's vital signs, intake and output and labs.  General: alert and cooperative GI: soft, non-tender; bowel sounds normal; no masses,  no organomegaly and incision: clean, dry and intact Extremities: no edema, redness or tenderness in the calves or thighs   Assessment/Plan: POD #2 s/p TAH doing well d/c home  Follow up with Dr. Neva Seat in 2 wks for incision check   LOS: 2 days    Mikayla Wheeler J. 09/20/2010, 6:32 PM

## 2010-09-20 NOTE — Progress Notes (Signed)
UR chart review completed.  

## 2010-09-24 ENCOUNTER — Encounter (HOSPITAL_COMMUNITY): Payer: Self-pay | Admitting: Obstetrics and Gynecology

## 2014-08-31 ENCOUNTER — Ambulatory Visit: Payer: Self-pay | Admitting: Internal Medicine

## 2014-09-15 ENCOUNTER — Ambulatory Visit (INDEPENDENT_AMBULATORY_CARE_PROVIDER_SITE_OTHER): Payer: Self-pay | Admitting: Internal Medicine

## 2014-09-15 ENCOUNTER — Other Ambulatory Visit (INDEPENDENT_AMBULATORY_CARE_PROVIDER_SITE_OTHER): Payer: Self-pay | Admitting: *Deleted

## 2014-09-15 ENCOUNTER — Encounter: Payer: Self-pay | Admitting: Internal Medicine

## 2014-09-15 VITALS — BP 124/70 | HR 63 | Temp 98.1°F | Resp 12 | Ht 63.5 in | Wt 210.8 lb

## 2014-09-15 DIAGNOSIS — E039 Hypothyroidism, unspecified: Secondary | ICD-10-CM

## 2014-09-15 DIAGNOSIS — Z23 Encounter for immunization: Secondary | ICD-10-CM

## 2014-09-15 DIAGNOSIS — E890 Postprocedural hypothyroidism: Secondary | ICD-10-CM

## 2014-09-15 MED ORDER — LEVOTHYROXINE SODIUM 150 MCG PO TABS: 150.0000 ug | ORAL_TABLET | Freq: Every day | ORAL | Status: DC

## 2014-09-15 NOTE — Patient Instructions (Signed)
Please stop at the lab.  Please decrease the Levothyroxine dose to 150 mcg daily.  Take the thyroid hormone - every day - with water - >30 minutes before breakfast - separated by >4 hours from acid reflux medications, calcium, iron, multivitamins.  Please come back in 5-6 weeks for labs.  Please come back for a follow-up appointment in 3 months.

## 2014-09-15 NOTE — Progress Notes (Signed)
Patient ID: Mikayla Wheeler, female   DOB: [birth year], 54 y.o.   MRN: [id number]   HPI  Mikayla Wheeler is a 54 y.o.-year-old female, referred by her PCP, Lennie Odor, PA,, for management of uncontrolled hypothyroidism. Insurance: BCBS  Pt. has been dx with hypothyroidism 1980s ([facility] ds), after ablation for hyperthyroidism; is on Levothyroxine (also on Synthroid) 300 mcg now (increased from 200 mcg 6 mo ago), taken: - mostly fasting! - with water, milk! - b'fast before or after! - no calcium, iron - + PPIs (Omeprazole x2) along with Levothyroxine! - + multivitamin (centrum x1) along with Levothyroxine!  I reviewed pt's thyroid tests: 07/28/2012: TSH 0.07 (0.34-4.5) 01/29/2013: TSH 15.96 06/02/2013: TSH 14.18 07/29/2013: TSH 8.06 10/15/2013: TSH 7.03  12/22/2013: TSH 6.94 01/28/2014: TSH 0.17 02/25/2014: TSH 0.33 07/09/2014: TSH 0.27  Pt describes: - occasional palpitations - + blurry vision - + tremors - no weight gain/loss - + fatigue - + heat intolerance - + constipation - + dry skin - + hair loss - + anxiety + depression; + panic attacks  Pt denies feeling nodules in neck, hoarseness, dysphagia/odynophagia, SOB with lying down.  She has no FH of thyroid disorders. No FH of thyroid cancer.  No h/o radiation tx to head or neck. No recent use of iodine supplements.  ROS: Constitutional: + see HPI Eyes: no blurry vision, no xerophthalmia ENT: no sore throat, no nodules palpated in throat, no dysphagia/odynophagia, no hoarseness Cardiovascular: no CP/SOB/+ palpitations/+ leg swelling Respiratory: no cough/SOB Gastrointestinal: no N/V/D/+ C Musculoskeletal: no muscle/joint aches Skin: no rashes, + hair loss Neurological: + tremors/no numbness/tingling/dizziness Psychiatric: + both: depression/anxiety  Past Medical History  Diagnosis Date  . PONV (postoperative nausea and vomiting)   . Hypercholesteremia     simvastatin  . Depression   . Anxiety   .  Anemia     iron  . Hypothyroidism     synthroid  . GERD (gastroesophageal reflux disease)     instructed to take prilosec dos  . QJJHERDE(081.4)    Past Surgical History  Procedure Laterality Date  . Right breast lumpectomy  1998  . Hemorroidectomy  2007  . Colonoscopy    . Abdominal hysterectomy  09/18/2010    Procedure: HYSTERECTOMY ABDOMINAL;  Surgeon: Avel Sensor, MD;  Location: [facility];  Service: Gynecology;  Laterality: Bilateral;  Bilateral Salpingo Oophorectomy; with Lysis of Adhesions   Social History   Social History  . Marital Status: Married    Spouse Name: N/A  . Number of Children: 1 stepdaughter   Occupational History  . Truck driver, long distance   Social History Main Topics  . Smoking status: Never Smoker   . Smokeless tobacco: Never Used  . Alcohol Use: 1.2 oz/week    2 Cans of beer per week     Comment: weekends   . Drug Use: No   Current Outpatient Prescriptions on File Prior to Visit  Medication Sig Dispense Refill  . Iron-Folic [id number] (FERRAPLUS 90 PO) Take 1 tablet by mouth daily.     Marland Kitchen omeprazole (PRILOSEC OTC) 20 MG tablet Take 20 mg by mouth 2 (two) times daily as needed. Acid reflux      . sertraline (ZOLOFT) 100 MG tablet Take 100 mg by mouth daily.      . simvastatin (ZOCOR) 40 MG tablet Take 40 mg by mouth at bedtime.       No current facility-administered medications on file prior to visit.   Allergies  Allergen Reactions  . Amoxicillin  Other (See Comments)    Nose bleed per patient  . Morphine And Related Hives  . Latex Rash   Family History  Problem Relation Age of Onset  . Cancer Mother   . Hypertension Father   . Heart disease Father   . Cancer Father   . Cancer Brother    PE: BP 124/70 mmHg  Pulse 63  Temp(Src) 98.1 F (36.7 C) (Oral)  Resp 12  Ht 5' 3.5" (1.613 m)  Wt 210 lb 12.8 oz (95.618 kg)  BMI 36.75 kg/m2  SpO2 97%  LMP 08/11/2010 Wt Readings from Last 3 Encounters:  09/15/14 210 lb 12.8  oz (95.618 kg)  09/13/10 205 lb (92.987 kg)   Constitutional:  obese, in NAD Eyes: PERRLA, EOMI, no exophthalmos ENT: moist mucous membranes, no thyromegaly, no cervical lymphadenopathy Cardiovascular: RRR, No MRG, +  Bilateral nonpitting  Lower extremity swelling ,L>R Respiratory: CTA B Gastrointestinal: abdomen soft, NT, ND, BS+ Musculoskeletal: no deformities, strength intact in all 4 Skin: moist, warm, no rashes Neurological: + tremor with outstretched hands, DTR normal in all 4  ASSESSMENT: 1. Uncontrolled Hypothyroidism  PLAN:  1. Patient with long-standing hypothyroidism, on levothyroxine therapy, with abnormal thyroid tests 2/2 incorrect dosing of Levothyroxine. Latest TSH levels have been suppressed and she has thyrotoxic sxs: tremors: heat intolerance, tremors, palpitations. No weight loss, no hyperdefecation, no tachycardia. She does not appear to have a goiter, thyroid nodules, or neck compression symptoms - We discussed about correct intake of levothyroxine, fasting, with water, separated by at least 30 minutes from breakfast, and separated by more than 4 hours from calcium, iron, multivitamins, acid reflux medications (PPIs). She is taking it incorrectly now, which is likely there reason for her continuing increase in LT4 dose:  Takes the medication not always fasting, with water or milk,  And, most importantly, takes it with multivitamins and PPIs. All these will greatly decrease the absorption of levothyroxine, we had a long discussion about how to start taking the medication correctly.  I will also decrease her dose to half, to 150 mcg daily - will check thyroid tests in 5-6 weeks: TSH, free T4 - If these are normal, I will see her back in 3 months

## 2014-10-27 ENCOUNTER — Other Ambulatory Visit (INDEPENDENT_AMBULATORY_CARE_PROVIDER_SITE_OTHER): Payer: BLUE CROSS/BLUE SHIELD

## 2014-10-27 DIAGNOSIS — E890 Postprocedural hypothyroidism: Secondary | ICD-10-CM

## 2014-10-27 LAB — TSH: TSH: 2.76 u[IU]/mL (ref 0.35–4.50)

## 2014-10-27 LAB — T4, FREE: FREE T4: 1.32 ng/dL (ref 0.60–1.60)

## 2014-10-28 ENCOUNTER — Other Ambulatory Visit: Payer: Self-pay | Admitting: *Deleted

## 2014-10-28 MED ORDER — LEVOTHYROXINE SODIUM 150 MCG PO TABS: 150.0000 ug | ORAL_TABLET | Freq: Every day | ORAL | Status: DC

## 2014-12-28 ENCOUNTER — Ambulatory Visit (INDEPENDENT_AMBULATORY_CARE_PROVIDER_SITE_OTHER): Payer: BLUE CROSS/BLUE SHIELD | Admitting: Internal Medicine

## 2014-12-28 ENCOUNTER — Encounter: Payer: Self-pay | Admitting: Internal Medicine

## 2014-12-28 VITALS — BP 120/70 | HR 72 | Temp 98.0°F | Resp 12 | Wt 219.0 lb

## 2014-12-28 DIAGNOSIS — E890 Postprocedural hypothyroidism: Secondary | ICD-10-CM | POA: Diagnosis not present

## 2014-12-28 LAB — TSH: TSH: 4.91 u[IU]/mL — AB (ref 0.35–4.50)

## 2014-12-28 LAB — T4, FREE: Free T4: 1.18 ng/dL (ref 0.60–1.60)

## 2014-12-28 NOTE — Patient Instructions (Signed)
Please stop at the lab.  Please continue Levothyroxine dose 150 mcg daily.  Continue to take it: - every day - with water - >30 minutes before breakfast - separated by >4 hours from acid reflux medications, calcium, iron, multivitamins.  Please come back for a follow-up appointment in 6 months.

## 2014-12-28 NOTE — Progress Notes (Signed)
Patient ID: Mikayla Wheeler, female   DOB: 1960-12-24, 54 y.o.   MRN: [id number]   HPI  Mikayla Wheeler is a 54 y.o.-year-old female, initially referred by her PCP, Noelle Redmon, PA, now returning for f/u for uncontrolled hypothyroidism. Last visit 3.5 months ago. Insurance: BCBS  Reviewed history:  Pt. has been dx with hypothyroidism 1980s ([facility] ds), after ablation for hyperthyroidism.  She is on Levothyroxine (previously on Synthroid). At last visit, she was on 300 mcg daily, but she was not taking it correctly. We separated levothyroxine from meals, advise her to take it with water, and not milk, and we moved omeprazole and multivitamins at least 4 hours after LT4. By making the above changes, we were able to decrease her levothyroxine down to 150 g daily, and subsequent TSH finally returned normal.  She is now taking the LT4: - Fasting - with water - 30 minutes before breakfast - no calcium, iron - PPI and MVI later in the day, noontime  I reviewed pt's thyroid tests: Lab Results  Component Value Date   TSH 2.76 10/27/2014   07/28/2012: TSH 0.07 (0.34-4.5) 01/29/2013: TSH 15.96 06/02/2013: TSH 14.18 07/29/2013: TSH 8.06 10/15/2013: TSH 7.03  12/22/2013: TSH 6.94 01/28/2014: TSH 0.17 02/25/2014: TSH 0.33 07/09/2014: TSH 0.27  Pt describes: - + fatigue, + stress at work >> may need to change jobs - feeling better: no more body aches - no more palpitations - + tremors - + weight gain - improved heat intolerance - + constipation - + hair loss  Pt denies feeling nodules in neck, hoarseness, dysphagia/odynophagia, SOB with lying down.  She has no FH of thyroid disorders. No FH of thyroid cancer.  No h/o radiation tx to head or neck. No recent use of iodine supplements.  ROS: Constitutional: + see HPI Eyes: no blurry vision, no xerophthalmia ENT: no sore throat, no nodules palpated in throat, no dysphagia/odynophagia, no hoarseness Cardiovascular: no  CP/SOB/palpitations/+ leg swelling R>L Respiratory: no cough/SOB Gastrointestinal: no N/V/D/C Musculoskeletal: no muscle/joint aches Skin: no rashes, + hair loss Neurological: + tremors/no numbness/tingling/dizziness  I reviewed pt's medications, allergies, PMH, social hx, family hx, and changes were documented in the history of present illness. Otherwise, unchanged from my initial visit note.  Past Medical History  Diagnosis Date  . PONV (postoperative nausea and vomiting)   . Hypercholesteremia     simvastatin  . Depression   . Anxiety   . Anemia     iron  . Hypothyroidism     synthroid  . GERD (gastroesophageal reflux disease)     instructed to take prilosec dos  . [id number])    Past Surgical History  Procedure Laterality Date  . Right breast lumpectomy  1998  . Hemorroidectomy  2007  . Colonoscopy    . Abdominal hysterectomy  09/18/2010    Procedure: HYSTERECTOMY ABDOMINAL;  Surgeon: Avel Sensor, MD;  Location: [facility];  Service: Gynecology;  Laterality: Bilateral;  Bilateral Salpingo Oophorectomy; with Lysis of Adhesions   Social History   Social History  . Marital Status: Married    Spouse Name: N/A  . Number of Children: 1 stepdaughter   Occupational History  . Truck driver, long distance   Social History Main Topics  . Smoking status: Never Smoker   . Smokeless tobacco: Never Used  . Alcohol Use: 1.2 oz/week    2 Cans of beer per week     Comment: weekends   . Drug Use: No   Current Outpatient Prescriptions on File  Prior to Visit  Medication Sig Dispense Refill  . aspirin 325 MG tablet Take 325 mg by mouth daily. 1/2 TABLET DAILY    . buPROPion (WELLBUTRIN XL) 300 MG 24 hr tablet Take 300 mg by mouth daily.   0  . Cholecalciferol (VITAMIN D3) 2000 UNITS TABS Take 1 tablet by mouth daily.    . Esomeprazole Magnesium (NEXIUM PO) Take 44.6 mg by mouth daily.    Marland Kitchen levothyroxine (SYNTHROID, LEVOTHROID) 150 MCG tablet Take 1 tablet (150 mcg total) by  mouth daily. 45 tablet 2  . Multiple Vitamins-Minerals (CENTRUM SILVER PO) Take 1 tablet by mouth daily.    . propranolol (INDERAL) 20 MG tablet Take 30 mg by mouth 2 (two) times daily.   0  . sertraline (ZOLOFT) 100 MG tablet Take 100 mg by mouth daily.      . simvastatin (ZOCOR) 40 MG tablet Take 40 mg by mouth at bedtime.      . vitamin B-12 (CYANOCOBALAMIN) 1000 MCG tablet Take 1,000 mcg by mouth daily.     No current facility-administered medications on file prior to visit.   Allergies  Allergen Reactions  . Amoxicillin Other (See Comments)    Nose bleed per patient  . Morphine And Related Hives  . Latex Rash   Family History  Problem Relation Age of Onset  . Cancer Mother   . Hypertension Father   . Heart disease Father   . Cancer Father   . Cancer Brother    PE: BP 120/70 mmHg  Pulse 72  Temp(Src) 98 F (36.7 C) (Oral)  Resp 12  Wt 219 lb (99.338 kg)  SpO2 98%  LMP 08/11/2010 Body mass index is 38.18 kg/(m^2). Wt Readings from Last 3 Encounters:  12/28/14 219 lb (99.338 kg)  09/15/14 210 lb 12.8 oz (95.618 kg)  09/13/10 205 lb (92.987 kg)   Constitutional:  obese, in NAD Eyes: PERRLA, EOMI, no exophthalmos ENT: moist mucous membranes, no thyromegaly, no cervical lymphadenopathy Cardiovascular: RRR, No MRG, +  Bilateral nonpitting lower extremity swelling ,R>L Respiratory: CTA B Gastrointestinal: abdomen soft, NT, ND, BS+ Musculoskeletal: no deformities, strength intact in all 4 Skin: moist, warm, no rashes Neurological: + tremor with outstretched hands R>L, DTR normal in all 4  ASSESSMENT: 1. Post-ablative Hypothyroidism  PLAN:  1. Patient with long-standing hypothyroidism, on levothyroxine therapy, with abnormal thyroid tests 2/2 incorrect dosing of Levothyroxine for many years, now normalized after she started to take it correctly >> now on 150 mcg LT4 daily. Latest TSH levels reviewed >> normal, and she feels much better. No more mm pain and palpitations.  She does not appear to have a goiter, thyroid nodules, or neck compression symptoms - We again discussed about correct intake of levothyroxine, fasting, with water, separated by at least 30 minutes from breakfast, and separated by more than 4 hours from calcium, iron, multivitamins, acid reflux medications (PPIs). She is taking it correctly now. - will check thyroid tests today: TSH, free T4 - continue LT4 150 mcg daily I will see her back in 6 months  Office Visit on 12/28/2014  Component Date Value Ref Range Status  . Free T4 12/28/2014 1.18  0.60 - 1.60 ng/dL Final  . TSH 12/28/2014 4.91* 0.35 - 4.50 uIU/mL Final   TSH a little high, despite being normal at previous check on the same dose. I would like to repeat the TFTs in 5 weeks. If TSH still high despite compliance, we'll need to increase dose to 175 g  daily.

## 2015-02-18 ENCOUNTER — Other Ambulatory Visit: Payer: BLUE CROSS/BLUE SHIELD

## 2015-02-21 ENCOUNTER — Other Ambulatory Visit (INDEPENDENT_AMBULATORY_CARE_PROVIDER_SITE_OTHER): Payer: BLUE CROSS/BLUE SHIELD

## 2015-02-21 DIAGNOSIS — E890 Postprocedural hypothyroidism: Secondary | ICD-10-CM | POA: Diagnosis not present

## 2015-02-21 LAB — TSH: TSH: 3.51 u[IU]/mL (ref 0.35–4.50)

## 2015-02-21 LAB — T4, FREE: FREE T4: 1.33 ng/dL (ref 0.60–1.60)

## 2015-04-21 ENCOUNTER — Other Ambulatory Visit: Payer: Self-pay | Admitting: Internal Medicine

## 2015-06-28 ENCOUNTER — Encounter: Payer: Self-pay | Admitting: Internal Medicine

## 2015-06-28 ENCOUNTER — Ambulatory Visit (INDEPENDENT_AMBULATORY_CARE_PROVIDER_SITE_OTHER): Payer: BLUE CROSS/BLUE SHIELD | Admitting: Internal Medicine

## 2015-06-28 VITALS — BP 128/78 | HR 77 | Ht 64.0 in | Wt 216.0 lb

## 2015-06-28 DIAGNOSIS — E890 Postprocedural hypothyroidism: Secondary | ICD-10-CM | POA: Diagnosis not present

## 2015-06-28 NOTE — Progress Notes (Signed)
Patient ID: Mikayla Wheeler, female   DOB: 01/09/1960, 55 y.o.   MRN: [id number]   HPI  Mikayla Wheeler is a 55 y.o.-year-old female, initially referred by her PCP, Noelle Redmon, PA, now returning for f/u for uncontrolled hypothyroidism. Last visit 6 months ago. Insurance: BCBS  Reviewed history:  Pt. has been dx with hypothyroidism 1980s ([facility] ds), after ablation for hyperthyroidism.  She is on Levothyroxine (previously on Synthroid). She was on 300 mcg daily, but she was not taking it correctly. We separated levothyroxine from meals, advise her to take it with water, and not milk, and we moved omeprazole and multivitamins at least 4 hours after LT4. By making the above changes, we were able to decrease her levothyroxine down to 150 g daily, and subsequent TSH finally returned normal.  She is now taking the LT4: - 12 am - 6 am (!) Eats dinner at 9 pm. 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  She has no FH of thyroid disorders. No FH of thyroid cancer.  No h/o radiation tx to head or neck. No recent use of iodine supplements.  ROS: Constitutional: + see HPI Eyes: no blurry vision, no xerophthalmia ENT: no sore throat, no nodules palpated in throat, no  dysphagia/odynophagia, no hoarseness Cardiovascular: no CP/SOB/palpitations/+ leg swelling R>L Respiratory: no cough/SOB Gastrointestinal: no N/V/D/C Musculoskeletal: no muscle/joint aches Skin: no rashes, + hair loss Neurological: + tremors/no numbness/tingling/dizziness  I reviewed pt's medications, allergies, PMH, social hx, family hx, and changes were documented in the history of present illness. Otherwise, unchanged from my initial visit note.  Past Medical History  Diagnosis Date  . PONV (postoperative nausea and vomiting)   . Hypercholesteremia     simvastatin  . Depression   . Anxiety   . Anemia     iron  . Hypothyroidism     synthroid  . GERD (gastroesophageal reflux disease)     instructed to take prilosec dos  . [id number])    Past Surgical History  Procedure Laterality Date  . Right breast lumpectomy  1998  . Hemorroidectomy  2007  . Colonoscopy    . Abdominal hysterectomy  09/18/2010    Procedure: HYSTERECTOMY ABDOMINAL;  Surgeon: Avel Sensor, MD;  Location: [facility];  Service: Gynecology;  Laterality: Bilateral;  Bilateral Salpingo Oophorectomy; with Lysis of Adhesions   Social History   Social History  . Marital Status: Married    Spouse Name: N/A  . Number of Children: 1 stepdaughter   Occupational History  . Truck driver, long distance   Social History Main Topics  . Smoking status: Never Smoker   . Smokeless tobacco: Never Used  . Alcohol Use: 1.2 oz/week    2 Cans of beer per week  Comment: weekends   . Drug Use: No   Current Outpatient Prescriptions on File Prior to Visit  Medication Sig Dispense Refill  . aspirin 325 MG tablet Take 325 mg by mouth daily. 1/2 TABLET DAILY    . buPROPion (WELLBUTRIN XL) 300 MG 24 hr tablet Take 300 mg by mouth daily.   0  . Cholecalciferol (VITAMIN D3) 2000 UNITS TABS Take 1 tablet by mouth daily.    . Esomeprazole Magnesium (NEXIUM PO) Take 44.6 mg by mouth daily.    Marland Kitchen levothyroxine (SYNTHROID,  LEVOTHROID) 150 MCG tablet take 1 tablet by mouth once daily 45 tablet 2  . Multiple Vitamins-Minerals (CENTRUM SILVER PO) Take 1 tablet by mouth daily.    . propranolol (INDERAL) 20 MG tablet Take 30 mg by mouth 2 (two) times daily.   0  . sertraline (ZOLOFT) 100 MG tablet Take 100 mg by mouth daily.      . simvastatin (ZOCOR) 40 MG tablet Take 40 mg by mouth at bedtime.      . vitamin B-12 (CYANOCOBALAMIN) 1000 MCG tablet Take 1,000 mcg by mouth daily.     No current facility-administered medications on file prior to visit.   Allergies  Allergen Reactions  . Amoxicillin Other (See Comments)    Nose bleed per patient  . Morphine And Related Hives  . Latex Rash   Family History  Problem Relation Age of Onset  . Cancer Mother   . Hypertension Father   . Heart disease Father   . Cancer Father   . Cancer Brother    PE: BP 128/78 mmHg  Pulse 77  Ht 5\' 4"  (1.626 m)  Wt 216 lb (97.977 kg)  BMI 37.06 kg/m2  SpO2 96%  LMP 08/11/2010 Body mass index is 37.06 kg/(m^2). Wt Readings from Last 3 Encounters:  06/28/15 216 lb (97.977 kg)  12/28/14 219 lb (99.338 kg)  09/15/14 210 lb 12.8 oz (95.618 kg)   Constitutional:  obese, in NAD Eyes: PERRLA, EOMI, no exophthalmos ENT: moist mucous membranes, no thyromegaly, no cervical lymphadenopathy Cardiovascular: RRR, No MRG, +  Bilateral pitting lower extremity swelling ,R>L Respiratory: CTA B Gastrointestinal: abdomen soft, NT, ND, BS+ Musculoskeletal: no deformities, strength intact in all 4 Skin: moist, warm, no rashes Neurological: + tremor with outstretched hands R>L, DTR normal in all 4  ASSESSMENT: 1. Post-ablative Hypothyroidism  PLAN:  1. Patient with long-standing hypothyroidism, on levothyroxine therapy, with abnormal thyroid tests 2/2 incorrect dosing of Levothyroxine for many years, now normalized,  on 150 mcg LT4 daily.  - Latest TSH levels reviewed >> normal, and she feels much better. No more mm pain and palpitations.   - She does not appear to have a goiter, thyroid nodules, or neck compression symptoms - We again discussed about correct intake of levothyroxine, fasting, with water, separated by at least 30 minutes from breakfast, and separated by more than 4 hours from calcium, iron, multivitamins, acid reflux medications (PPIs). She is still not taking the levothyroxine correctly, taking it between 12 AM and 6 AM. Since she is a late dinner, she is not on an empty stomach when she takes the medication. I advised her to move it in the morning and drink coffee (she adds creamer) or eat breakfast 30 minutes later. - will check thyroid tests in 5-6 weeks after the above change: TSH, free T4 - continue LT4 150 mcg daily - I will see her back in one year

## 2015-06-28 NOTE — Patient Instructions (Signed)
Please continue Levothyroxine 150 mcg daily.  Take the thyroid hormone every day, with water, at least 30 minutes before breakfast, separated by at least 4 hours from: - acid reflux medications - calcium - iron - multivitamins  Take it as you wake up in the morning. Eat b'fast or drink coffee >30 min later.  Come back for labs in 5-6 weeks.  Please return in 1 year.

## 2015-08-02 ENCOUNTER — Other Ambulatory Visit: Payer: BLUE CROSS/BLUE SHIELD

## 2015-08-23 ENCOUNTER — Other Ambulatory Visit: Payer: BLUE CROSS/BLUE SHIELD

## 2015-08-24 ENCOUNTER — Other Ambulatory Visit (INDEPENDENT_AMBULATORY_CARE_PROVIDER_SITE_OTHER): Payer: BLUE CROSS/BLUE SHIELD

## 2015-08-24 DIAGNOSIS — E890 Postprocedural hypothyroidism: Secondary | ICD-10-CM | POA: Diagnosis not present

## 2015-08-24 LAB — T4, FREE: FREE T4: 1.19 ng/dL (ref 0.60–1.60)

## 2015-08-24 LAB — TSH: TSH: 4 u[IU]/mL (ref 0.35–4.50)

## 2015-08-25 ENCOUNTER — Telehealth: Payer: Self-pay

## 2015-08-25 NOTE — Telephone Encounter (Signed)
Called patient and gave lab results. Patient had no questions or concerns.  

## 2015-11-23 ENCOUNTER — Other Ambulatory Visit: Payer: Self-pay | Admitting: Internal Medicine

## 2015-11-29 ENCOUNTER — Ambulatory Visit
Admission: RE | Admit: 2015-11-29 | Discharge: 2015-11-29 | Disposition: A | Discharge status: Home / Self Care | Payer: BLUE CROSS/BLUE SHIELD | Source: Ambulatory Visit | Attending: Physician Assistant | Admitting: Physician Assistant

## 2015-11-29 ENCOUNTER — Other Ambulatory Visit: Payer: Self-pay | Admitting: Physician Assistant

## 2015-11-29 DIAGNOSIS — M79672 Pain in left foot: Secondary | ICD-10-CM

## 2016-01-11 ENCOUNTER — Other Ambulatory Visit: Payer: Self-pay | Admitting: Gastroenterology

## 2016-01-30 ENCOUNTER — Encounter: Payer: Self-pay | Admitting: *Deleted

## 2016-01-30 ENCOUNTER — Encounter: Payer: Self-pay | Admitting: Neurology

## 2016-01-30 ENCOUNTER — Ambulatory Visit (INDEPENDENT_AMBULATORY_CARE_PROVIDER_SITE_OTHER): Payer: BLUE CROSS/BLUE SHIELD | Admitting: Neurology

## 2016-01-30 VITALS — BP 120/80 | HR 75 | Ht 64.0 in | Wt 222.6 lb

## 2016-01-30 DIAGNOSIS — H8110 Benign paroxysmal vertigo, unspecified ear: Secondary | ICD-10-CM

## 2016-01-30 DIAGNOSIS — R42 Dizziness and giddiness: Secondary | ICD-10-CM

## 2016-01-30 MED ORDER — MECLIZINE HCL 25 MG PO TABS: 25.0000 mg | ORAL_TABLET | Freq: Two times a day (BID) | ORAL | 2 refills | Status: DC | PRN

## 2016-01-30 NOTE — Progress Notes (Signed)
[facility] Clinic Note - Initial Visit   Date: 01/30/16  Mikayla Wheeler MRN: [id number] DOB: 1960/03/16   Dear Lennie Odor, PA:  Thank you for your kind referral of Mikayla Wheeler for consultation of dizziness. Although her history is well known to you, please allow [place] to reiterate it for the purpose of our medical record. The patient was accompanied to the clinic by self.    History of Present Illness: Mikayla Wheeler is a 56 y.o. right-handed Caucasian female with GERD, hypothyroidism, hyperlipidemia, and depression presenting for evaluation of dizziness.    Starting in the fall of 2017, she started having spells of dizziness, lasting for about 15-seconds.  She has mild nausea and fatigue with these spells.  She also feels as if she is pulled to the left when it occurs.   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  She works as a Arts administrator.  These spells do not occur when she is driving her own car.   There is no loss of consciousness or abnormal movements.  She endorses turning her head constantly while driving, but has not noticed that her spells are worse with any head movement.  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  She is on short-term disability currently.   Out-side paper records, electronic medical record, and images have been reviewed where available and summarized as:  CT head 12/02/2015:  Normal  Past Medical History:  Diagnosis Date  . Anemia    iron  . Anxiety   . Depression   . GERD (gastroesophageal reflux disease)    instructed to take prilosec dos  . Headache(784.0)   . Hypercholesteremia    simvastatin  . Hypothyroidism    synthroid  . PONV (postoperative nausea and vomiting)     Past Surgical History:  Procedure Laterality Date  . ABDOMINAL HYSTERECTOMY  09/18/2010   Procedure: HYSTERECTOMY ABDOMINAL;  Surgeon: Avel Sensor, MD;   Location: [facility];  Service: Gynecology;  Laterality: Bilateral;  Bilateral Salpingo Oophorectomy; with Lysis of Adhesions  . COLONOSCOPY    . HEMORROIDECTOMY  2007  . right breast lumpectomy  1998     Medications:  Outpatient Encounter Prescriptions as of 01/30/2016  Medication Sig Note  . Ascorbic Acid (VITAMIN C PO) Take by mouth.   Marland Kitchen aspirin 325 MG tablet Take 81 mg by mouth daily. 1/2 TABLET DAILY    . buPROPion (WELLBUTRIN XL) 300 MG 24 hr tablet Take 300 mg by mouth daily.  09/15/2014: Received from: External Pharmacy  . Cholecalciferol (VITAMIN D3) 2000 UNITS TABS Take 1 tablet by mouth daily.   . Esomeprazole Magnesium (NEXIUM PO) Take 44.6 mg by mouth daily.   Marland Kitchen levothyroxine (SYNTHROID, LEVOTHROID) 150 MCG tablet take 1 tablet by mouth once daily   . Multiple Vitamins-Minerals (CENTRUM SILVER PO) Take 1 tablet by mouth daily.   . propranolol (INDERAL) 20 MG tablet Take 30 mg by mouth 2 (two) times daily.  09/15/2014: Received from: External Pharmacy  . sertraline (ZOLOFT) 100 MG tablet Take 100 mg by mouth daily.     . simvastatin (ZOCOR) 40 MG tablet Take 40 mg by mouth at bedtime.     . vitamin B-12 (CYANOCOBALAMIN) 1000 MCG tablet Take 1,000 mcg by mouth daily.    No facility-administered encounter medications on file as of 01/30/2016.      Allergies:  Allergies  Allergen Reactions  . Amoxicillin  Other (See Comments)    Nose bleed per patient  . Morphine And Related Hives  . Latex Rash    Family History: Family History  Problem Relation Age of Onset  . Cancer Mother   . Hypertension Father   . Heart disease Father   . Cancer Father   . Alzheimer's disease Father   . Cancer Brother     Social History: Social History  Substance Use Topics  . Smoking status: Never Smoker  . Smokeless tobacco: Never Used  . Alcohol use 1.2 oz/week    2 Cans of beer per week     Comment: weekends    Social History   Social History Narrative   Lives with husband in a one  story home.  Has one step daughter.  Works as a Administrator.  Education: high school.    Review of Systems:  CONSTITUTIONAL: No fevers, chills, night sweats, or weight loss.   EYES: No visual changes or eye pain ENT: No hearing changes.  No history of nose bleeds.   RESPIRATORY: No cough, wheezing and shortness of breath.   CARDIOVASCULAR: Negative for chest pain, and palpitations.   GI: Negative for abdominal discomfort, blood in stools or black stools.  No recent change in bowel habits.   GU:  No history of incontinence.   MUSCLOSKELETAL: No history of joint pain or swelling.  No myalgias.   SKIN: Negative for lesions, rash, and itching.   HEMATOLOGY/ONCOLOGY: Negative for prolonged bleeding, bruising easily, and swollen nodes.  No history of cancer.   ENDOCRINE: Negative for cold or heat intolerance, polydipsia or goiter.   PSYCH:  +depression or anxiety symptoms.   NEURO: As Above.   Vital Signs:  BP 120/80   Pulse 75   Ht 5\' 4"  (1.626 m)   Wt 222 lb 9 oz (101 kg)   LMP 09/14/2010   SpO2 97%   BMI 38.20 kg/m  Orthostatic VS for the past 24 hrs:  BP- Lying Pulse- Lying BP- Sitting Pulse- Sitting BP- Standing at 0 minutes Pulse- Standing at 0 minutes  01/30/16 0959 120/80 77 128/84 64 128/86 66       General Medical Exam:   General:  Well appearing, comfortable, intermittent head tremor.   Eyes/ENT: see cranial nerve examination.   Neck: No masses appreciated.  Full range of motion without tenderness.  No carotid bruits. Respiratory:  Clear to auscultation, good air entry bilaterally.   Cardiac:  Regular rate and rhythm, no murmur.   Extremities:  No deformities, edema, or skin discoloration.  Skin:  No rashes or lesions.  Neurological Exam: MENTAL STATUS including orientation to time, place, person, recent and remote memory, attention span and concentration, language, and fund of knowledge is normal.  Speech is not dysarthric.  CRANIAL NERVES: II:  No visual field  defects.  Unremarkable fundi.   III-IV-VI: Pupils equal round and reactive to light.  Normal conjugate, extra-ocular eye movements in all directions of gaze.  Mild fatigable nystagmus to left.  No ptosis.  V:  Normal facial sensation.   VII:  Normal facial symmetry and movements. VIII:  Normal hearing and vestibular function.  Head thrust negative.  IX-X:  Normal palatal movement.   XI:  Normal shoulder shrug and head rotation.   XII:  Normal tongue strength and range of motion, no deviation or fasciculation.  MOTOR:  No atrophy, fasciculations or abnormal movements.  No pronator drift.  Tone is normal.    Right Upper Extremity:  Left Upper Extremity:    Deltoid  5/5   Deltoid  5/5   Biceps  5/5   Biceps  5/5   Triceps  5/5   Triceps  5/5   Wrist extensors  5/5   Wrist extensors  5/5   Wrist flexors  5/5   Wrist flexors  5/5   Finger extensors  5/5   Finger extensors  5/5   Finger flexors  5/5   Finger flexors  5/5   Dorsal interossei  5/5   Dorsal interossei  5/5   Abductor pollicis  5/5   Abductor pollicis  5/5   Tone (Ashworth scale)  0  Tone (Ashworth scale)  0   Right Lower Extremity:    Left Lower Extremity:    Hip flexors  5/5   Hip flexors  5/5   Hip extensors  5/5   Hip extensors  5/5   Knee flexors  5/5   Knee flexors  5/5   Knee extensors  5/5   Knee extensors  5/5   Dorsiflexors  5/5   Dorsiflexors  5/5   Plantarflexors  5/5   Plantarflexors  5/5   Toe extensors  5/5   Toe extensors  5/5   Toe flexors  5/5   Toe flexors  5/5   Tone (Ashworth scale)  0  Tone (Ashworth scale)  0   MSRs:  Right                                                                 Left brachioradialis 2+  brachioradialis 2+  biceps 2+  biceps 2+  triceps 2+  triceps 2+  patellar 2+  patellar 2+  ankle jerk 2+  ankle jerk 2+  Hoffman no  Hoffman no  plantar response down  plantar response down   SENSORY:  Normal and symmetric perception of light touch, pinprick, vibration, and  proprioception.  Romberg's sign absent.   COORDINATION/GAIT: Normal finger-to- nose-finger and heel-to-shin.  Intact rapid alternating movements bilaterally. Gait narrow based and stable. Tandem and stressed gait intact.    IMPRESSION: Episodic vertigo most suggestive of benign positional paroxysmal vertigo  - Start meclizine 25mg  daily as needed  - Start vestibular therapy  - US carotids to be sure this is not stemming from carotid disease  - No findings on exam to suggest her dizziness is coming from a central nervous system etiology.  If she develops new neurological symptoms, proceed with MRI brain  - Recommend evaluation with ENT provider if her vertigo persists  The duration of this appointment visit was 45 minutes of face-to-face time with the patient.  Greater than 50% of this time was spent in counseling, explanation of diagnosis, planning of further management, and coordination of care.   Thank you for allowing me to participate in patient's care.  If I can answer any additional questions, I would be pleased to do so.    Sincerely,    Taegan Haider K. Posey Pronto, DO

## 2016-01-30 NOTE — Patient Instructions (Addendum)
1.  US carotids 2.  Start vestibular therapy 3.  Start meclizine 25mg  twice daily as needed for dizzy spells 4.  If you do not improve, discuss with your PCP regarding an ENT specialist

## 2016-01-31 ENCOUNTER — Telehealth: Payer: Self-pay | Admitting: Neurology

## 2016-01-31 NOTE — Telephone Encounter (Signed)
Left message for patient to call me back. 

## 2016-01-31 NOTE — Telephone Encounter (Signed)
PT left a message regarding her referral for Vertigo/Dawn CB# [phone]

## 2016-02-01 ENCOUNTER — Ambulatory Visit (HOSPITAL_COMMUNITY)
Admission: RE | Admit: 2016-02-01 | Discharge: 2016-02-01 | Disposition: A | Discharge status: Home / Self Care | Payer: BLUE CROSS/BLUE SHIELD | Source: Ambulatory Visit | Attending: Cardiovascular Disease | Admitting: Cardiovascular Disease

## 2016-02-01 ENCOUNTER — Other Ambulatory Visit: Payer: Self-pay | Admitting: *Deleted

## 2016-02-01 DIAGNOSIS — R42 Dizziness and giddiness: Secondary | ICD-10-CM | POA: Diagnosis not present

## 2016-02-01 DIAGNOSIS — H8110 Benign paroxysmal vertigo, unspecified ear: Secondary | ICD-10-CM | POA: Diagnosis not present

## 2016-02-02 ENCOUNTER — Telehealth: Payer: Self-pay | Admitting: *Deleted

## 2016-02-02 NOTE — Telephone Encounter (Signed)
Patient given results

## 2016-02-02 NOTE — Telephone Encounter (Signed)
-----   Message from Alda Berthold, DO sent at 02/02/2016 11:17 AM EST ----- Please inform patient that her carotids look great, nothing worrisome to explain her dizziness.

## 2016-02-10 ENCOUNTER — Ambulatory Visit: Payer: BLUE CROSS/BLUE SHIELD

## 2016-02-13 ENCOUNTER — Ambulatory Visit: Payer: BLUE CROSS/BLUE SHIELD | Admitting: Neurology

## 2016-02-14 ENCOUNTER — Ambulatory Visit: Payer: BLUE CROSS/BLUE SHIELD | Attending: Neurology | Admitting: Physical Therapy

## 2016-02-14 ENCOUNTER — Ambulatory Visit (HOSPITAL_COMMUNITY): Admit: 2016-02-14 | Payer: BLUE CROSS/BLUE SHIELD | Admitting: Gastroenterology

## 2016-02-14 ENCOUNTER — Encounter (HOSPITAL_COMMUNITY): Payer: Self-pay

## 2016-02-14 ENCOUNTER — Encounter: Payer: Self-pay | Admitting: Physical Therapy

## 2016-02-14 DIAGNOSIS — R42 Dizziness and giddiness: Secondary | ICD-10-CM | POA: Insufficient documentation

## 2016-02-14 SURGERY — COLONOSCOPY WITH PROPOFOL
Anesthesia: Monitor Anesthesia Care

## 2016-02-14 NOTE — Patient Instructions (Signed)
Gaze Stabilization: Tip Card 1.Target must remain in focus, not blurry, and appear stationary while head is in motion. 2.Perform exercises with small head movements (45 to either side of midline). 3.Increase speed of head motion so long as target is in focus. 4.If you wear eyeglasses, be sure you can see target through lens (therapist will give specific instructions for bifocal / progressive lenses). 5.These exercises may provoke dizziness or nausea. Work through these symptoms. If too dizzy, slow head movement slightly. Rest between each exercise. 6.Exercises demand concentration; avoid distractions. 7.For safety, perform standing exercises close to a counter, wall, corner, or next to someone.  Copyright  VHI. All rights reserved.  Gaze Stabilization: Standing Feet Apart   Feet shoulder width apart, keeping eyes on target on wall __6__ feet away, tilt head down 15-30 and move head side to side for __60__ seconds. Repeat while moving head up and down for __60__ seconds. Do _3-5___ sessions per day. Repeat using target on pattern background.  Copyright  VHI. All rights reserved.   

## 2016-02-14 NOTE — Therapy (Signed)
[facility] [address] [place], [place], [address] Phone: [phone]   Fax:  [phone]  Physical Therapy Evaluation  Patient Details  Name: Mikayla Wheeler MRN: [id number] Date of Birth: 09-Nov-1960 Referring Provider: Narda Amber, DO  Encounter Date: 02/14/2016      PT End of Session - 02/15/16 2021    Visit Number 1   Number of Visits 4   Date for PT Re-Evaluation 03/15/16   Authorization Type BCBS   Authorization - Number of Visits 30   PT Start Time 1150   PT Stop Time 1236   PT Time Calculation (min) 46 min      Past Medical History:  Diagnosis Date  . Anemia    iron  . Anxiety   . Depression   . GERD (gastroesophageal reflux disease)    instructed to take prilosec dos  . Headache(784.0)   . Hypercholesteremia    simvastatin  . Hypothyroidism    synthroid  . PONV (postoperative nausea and vomiting)     Past Surgical History:  Procedure Laterality Date  . ABDOMINAL HYSTERECTOMY  09/18/2010   Procedure: HYSTERECTOMY ABDOMINAL;  Surgeon: Avel Sensor, MD;  Location: [facility];  Service: Gynecology;  Laterality: Bilateral;  Bilateral Salpingo Oophorectomy; with Lysis of Adhesions  . COLONOSCOPY    . HEMORROIDECTOMY  2007  . right breast lumpectomy  1998    There were no vitals filed for this visit.       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 2017 and has not had any episodes of vertigo since being out ot work:  pt reports no vertigo with driving her car since end of Nov. 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 No/denies            [facility] PT Assessment - 02/15/16 0001      Assessment   Medical Diagnosis Vertigo   Referring Provider Narda Amber, DO   Onset Date/Surgical Date --  Oct. 2017     Precautions   Precautions Other (comment)  Vertigo     Restrictions   Weight Bearing Restrictions No     Balance Screen   Has the patient fallen in the past 6 months No   Has the patient had a decrease in activity level because of a fear of falling?  No   Is the patient reluctant to leave their home because of a fear of falling?  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     Ambulation/Gait   Ambulation/Gait Yes   Ambulation/Gait Assistance 7: Independent   Ambulation Distance (Feet) 100 Feet   Assistive device None   Gait Pattern Within Functional Limits   Ambulation Surface Level;Indoor            Vestibular Assessment - 02/15/16 0001  Symptom Behavior   Type of Dizziness Spinning   Frequency of Dizziness only occurred when she was driving her tractor trailer truck   Duration of Dizziness seconds   Aggravating Factors Driving   Relieving Factors Comments  leaned  toward Lt side and extended arms holding steering wh     Occulomotor Exam   Occulomotor Alignment Normal   Spontaneous Absent   Smooth Pursuits Intact   Saccades Intact     Positional Testing   Dix-Hallpike Dix-Hallpike Right;Dix-Hallpike Left   Sidelying Test Sidelying Right;Sidelying Left     Dix-Hallpike Right   Dix-Hallpike Right Duration none   Dix-Hallpike Right Symptoms No nystagmus     Dix-Hallpike Left   Dix-Hallpike Left Duration none   Dix-Hallpike Left Symptoms No nystagmus     Sidelying Right   Sidelying Right Duration none   Sidelying Right Symptoms No nystagmus      Sidelying Left   Sidelying Left Duration none   Sidelying Left Symptoms No nystagmus                       PT Education - 02/15/16 2020    Education provided Yes   Education Details gaze stabilization in seated position   Person(s) Educated Patient   Methods Explanation;Demonstration;Handout   Comprehension Verbalized understanding;Returned demonstration             PT Long Term Goals - 02/15/16 2033      PT LONG TERM GOAL #1   Title Complete SOT and establish goal as appropriate.  03-15-16   Time 4   Period Weeks   Status New     PT LONG TERM GOAL #2   Title Independent in HEP for vestibular exercises.  03-15-16   Time 4   Period Weeks   Status New     PT LONG TERM GOAL #3   Title Assess DVA and establish goal as needed.  03-15-16   Time 4   Period Weeks   Status New               Plan - 02/15/16 2023    Clinical Impression Statement Pt is a 56 year old lady with c/o intermittent vertigo that only occurred while she was driving her tractor trailer on her job.  Pt reports episodes occurred spontaneously and fortunately only lasted for a few seconds. No nystagmus was noted during evaluation and no positional testing provoked vertigo, indicative that etiology of vertigo is not consistent with BPPV at this time.  PMH includes headaches, tremors, anxiety/depression and anemia.  Moderate decision making required in POC.                                                                                                                          Rehab Potential Fair  etiology of vertigo does not appear to be of vestibular system dysfunction    PT Frequency 1x / week   PT Duration 4 weeks  PT Treatment/Interventions Vestibular;ADLs/Self Care Home Management;Therapeutic activities;Therapeutic exercise;Balance training;Neuromuscular re-education;Patient/family education   PT Next Visit Plan do SOT:  do DVA   PT Home Exercise Plan x1 viewing   Consulted and  Agree with Plan of Care Patient      Patient will benefit from skilled therapeutic intervention in order to improve the following deficits and impairments:  Dizziness  Visit Diagnosis: Dizziness and giddiness - Plan: PT plan of care cert/re-cert     Problem List Patient Active Problem List   Diagnosis Date Noted  . 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 [place], [place], [address] Phone: [phone]   Fax:  [phone]  Name: Mikayla Wheeler MRN: [id number] Date of Birth: Jul 26, 1960

## 2016-02-15 ENCOUNTER — Encounter (HOSPITAL_COMMUNITY): Payer: BLUE CROSS/BLUE SHIELD

## 2016-02-22 ENCOUNTER — Ambulatory Visit: Payer: BLUE CROSS/BLUE SHIELD | Admitting: Physical Therapy

## 2016-02-22 DIAGNOSIS — R42 Dizziness and giddiness: Secondary | ICD-10-CM | POA: Diagnosis not present

## 2016-02-23 NOTE — Patient Instructions (Signed)
Feet Apart (Compliant Surface) Varied Arm Positions - Eyes Open   With eyes open, standing on compliant surface: PILLOW, feet shoulder width apart and arms AT YOUR SIDE, look at a stationary object. Hold  30-45 seconds. Repeat  3  times per session. Do 1-2 sessions per day.  Copyright  VHI. All rights reserved.                         Feet Together (Compliant Surface) Varied Arm Positions - Eyes Open   With eyes open, standing on compliant surface: PILLOW, feet together and arms AT YOUR SIDE, look at a stationary object. Hold 30-45 seconds. Repeat 3 times per session. Do 1-2 sessions per day.  Copyright  VHI. All rights reserved.                         Feet Apart (Compliant Surface) Varied Arm Positions - Eyes Closed   Stand on compliant surface: PILLOW with feet shoulder width apart and arms AT YOUR SIDE. Close eyes and visualize upright position. Hold 30-45 seconds. Repeat 3 times per session. Do 1-2 sessions per day.  Copyright  VHI. All rights reserved.                          Feet Together (Compliant Surface) Varied Arm Positions - Eyes Closed   Stand on compliant surface: PILLOW with feet together and arms out. Close eyes and visualize upright position. Hold 30-45 seconds. Repeat 3 times per session. Do 1-2 sessions per day.  Copyright  VHI. All rights reserved.    

## 2016-02-23 NOTE — Therapy (Signed)
[facility] [address] [place], [place], [address] Phone: [phone]   Fax:  [phone]  Physical Therapy Treatment  Patient Details  Name: Mikayla Wheeler MRN: [id number] Date of Birth: 03/05/1960 Referring Provider: Narda Amber, DO  Encounter Date: 02/22/2016      PT End of Session - 02/23/16 2147    Visit Number 2   Number of Visits 4   Date for PT Re-Evaluation 03/15/16   Authorization Type BCBS   Authorization - Visit Number 2   Authorization - Number of Visits 30   PT Start Time 1150   PT Stop Time [id number]   PT Time Calculation (min) 45 min      Past Medical History:  Diagnosis Date  . Anemia    iron  . Anxiety   . Depression   . GERD (gastroesophageal reflux disease)    instructed to take prilosec dos  . Headache(784.0)   . Hypercholesteremia    simvastatin  . Hypothyroidism    synthroid  . PONV (postoperative nausea and vomiting)     Past Surgical History:  Procedure Laterality Date  . ABDOMINAL HYSTERECTOMY  09/18/2010   Procedure: HYSTERECTOMY ABDOMINAL;  Surgeon: Avel Sensor, MD;  Location: [facility];  Service: Gynecology;  Laterality: Bilateral;  Bilateral Salpingo Oophorectomy; with Lysis of Adhesions  . COLONOSCOPY    . HEMORROIDECTOMY  2007  . right breast lumpectomy  1998    There were no vitals filed for this visit.      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 No/denies       Neuro Re-ed:  Sensory Organization Test score - 58/100 for composite score with N= 70/100  Somatosensory input WNL's Visual input 56/100 with N= 74/100 Vestibular input 43/100 with N= 53/100    Dynamic visual acuity 1 line difference from static visual acuity with c/o dizziness after testing stopped  Pt instructed in balance on foam exercises in corner - with EO and EC with head turns side to side and up/down                       PT Education - 02/23/16 2145    Education provided Yes   Education Details balance on foam with EO and EC   Person(s) Educated Patient   Methods Explanation;Demonstration;Handout   Comprehension Verbalized understanding;Returned demonstration             PT Long Term Goals - 02/15/16 2033      PT LONG TERM GOAL #1   Title Complete SOT and establish goal as appropriate.  03-15-16   Time 4   Period Weeks   Status New     PT LONG TERM GOAL #2   Title Independent in HEP for vestibular exercises.  03-15-16   Time 4   Period Weeks   Status New     PT LONG TERM GOAL #3   Title Assess DVA and establish goal as needed.  03-15-16   Time 4   Period Weeks   Status New               Plan - 02/23/16 2148  Clinical Impression Statement Pt's SOT score is decreased for composite, visual and vestibular inputs in maintaining balance, indicative of decreased vestibular function.  DVA WNL's with a 1 line difference, but pt did report dizziness when testing stopped.                       Rehab Potential Fair   PT Frequency 1x / week   PT Duration 4 weeks   PT Treatment/Interventions Vestibular;ADLs/Self Care Home Management;Therapeutic activities;Therapeutic exercise;Balance training;Neuromuscular re-education;Patient/family education   PT Next Visit Plan check HEP - cont vestibular exercises   PT Home Exercise Plan x1 viewing; balance on foam   Consulted and Agree with Plan of Care Patient      Patient will benefit from skilled therapeutic intervention in order to improve the following deficits and impairments:  Dizziness  Visit Diagnosis: Dizziness and giddiness     Problem List Patient Active Problem List    Diagnosis Date Noted  . 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 [place], [place], [address] Phone: [phone]   Fax:  [phone]  Name: Mikayla Wheeler MRN: [id number] Date of Birth: 07-Mar-1960

## 2016-03-05 ENCOUNTER — Ambulatory Visit: Payer: BLUE CROSS/BLUE SHIELD | Attending: Neurology | Admitting: Physical Therapy

## 2016-03-05 DIAGNOSIS — R42 Dizziness and giddiness: Secondary | ICD-10-CM | POA: Diagnosis not present

## 2016-03-05 NOTE — Therapy (Signed)
[facility] [address] [place], [place], [address] Phone: [phone]   Fax:  [phone]  Physical Therapy Treatment  Patient Details  Name: Mikayla Wheeler MRN: [id number] Date of Birth: [birth year] Referring Provider: Narda Amber, DO  Encounter Date: 03/05/2016      PT End of Session - 03/05/16 2026    Visit Number 3   Number of Visits 4   Date for PT Re-Evaluation 03/15/16   Authorization Type BCBS   Authorization - Visit Number 3   Authorization - Number of Visits 30   PT Start Time [id number]   PT Stop Time 1535   PT Time Calculation (min) 48 min      Past Medical History:  Diagnosis Date  . Anemia    iron  . Anxiety   . Depression   . GERD (gastroesophageal reflux disease)    instructed to take prilosec dos  . Headache(784.0)   . Hypercholesteremia    simvastatin  . Hypothyroidism    synthroid  . PONV (postoperative nausea and vomiting)     Past Surgical History:  Procedure Laterality Date  . ABDOMINAL HYSTERECTOMY  09/18/2010   Procedure: HYSTERECTOMY ABDOMINAL;  Surgeon: Avel Sensor, MD;  Location: [facility];  Service: Gynecology;  Laterality: Bilateral;  Bilateral Salpingo Oophorectomy; with Lysis of Adhesions  . COLONOSCOPY    . HEMORROIDECTOMY  2007  . right breast lumpectomy  1998    There were no vitals filed for this visit.      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 No/denies                 NeuroRe-ed: vestibular stimulation exercises - marching on incline and on decline with EO and EC - with  head turns with CGA  Alternate stepping up/back on incline for improved SLS Amb. Making circles with ball clockwise and counterclockwise with CGA  Sit to stand - pivot turn 90 degrees to Rt and Lt sides 5 reps each  Trunk rotation standing on pillow - with EO and EC with CGA                   Balance Exercises - 03/05/16 2025      Balance Exercises: Standing   Standing Eyes Opened Wide (BOA);Head turns;Foam/compliant surface;5 reps   Standing Eyes Closed Wide (BOA);Head turns;Foam/compliant surface;5 reps   Rockerboard Anterior/posterior;Lateral;Head turns;EO;EC;10 reps                PT Long Term Goals - 03/05/16 2030      PT LONG TERM GOAL #1   Title Complete SOT and establish goal as appropriate.  03-15-16  (SEE LTG #4 below)   Status Achieved     PT LONG TERM GOAL #2   Title Independent in HEP for vestibular exercises.  03-15-16   Period Weeks   Status New     PT LONG TERM GOAL #3   Title Assess DVA and establish goal as needed.  03-15-16   Baseline DVA WNL's    Status Deferred     PT LONG TERM GOAL #4  Title Increase composite SOT to WNL's to demo improved balance.  03-15-16               Plan - 03/05/16 2027    Clinical Impression Statement Pt reported no dizziness with any vestibular/visual activity today; SLS on LLE is decreased due to old previous injury.  Pt is progressing well towards goals; vestibular input appears to be increasing in maintaining balance.                         Rehab Potential Fair   PT Frequency 1x / week   PT Duration 4 weeks   PT Treatment/Interventions Vestibular;ADLs/Self Care Home Management;Therapeutic activities;Therapeutic exercise;Balance training;Neuromuscular re-education;Patient/family education   PT Next Visit Plan check LTG's - redo SOT -- D/C    PT Home Exercise Plan x1 viewing; balance on foam   Consulted and Agree with Plan of Care Patient      Patient will benefit from skilled therapeutic  intervention in order to improve the following deficits and impairments:  Dizziness  Visit Diagnosis: Dizziness and giddiness     Problem List Patient Active Problem List   Diagnosis Date Noted  . 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 [place], [place], [address] Phone: [phone]   Fax:  [phone]  Name: Mikayla Wheeler MRN: [id number] Date of Birth: [birth year]

## 2016-03-12 ENCOUNTER — Ambulatory Visit: Payer: BLUE CROSS/BLUE SHIELD | Admitting: Physical Therapy

## 2016-03-13 ENCOUNTER — Ambulatory Visit: Payer: BLUE CROSS/BLUE SHIELD | Admitting: Physical Therapy

## 2016-03-30 ENCOUNTER — Other Ambulatory Visit: Payer: Self-pay | Admitting: Neurology

## 2016-04-20 ENCOUNTER — Other Ambulatory Visit: Payer: Self-pay | Admitting: Internal Medicine

## 2016-05-30 ENCOUNTER — Other Ambulatory Visit: Payer: Self-pay | Admitting: Neurology

## 2016-06-25 ENCOUNTER — Ambulatory Visit: Payer: BLUE CROSS/BLUE SHIELD | Admitting: Internal Medicine

## 2016-07-09 ENCOUNTER — Ambulatory Visit: Payer: BLUE CROSS/BLUE SHIELD | Admitting: Internal Medicine

## 2016-07-13 ENCOUNTER — Encounter: Payer: Self-pay | Admitting: Internal Medicine

## 2016-07-13 ENCOUNTER — Ambulatory Visit (INDEPENDENT_AMBULATORY_CARE_PROVIDER_SITE_OTHER): Payer: BLUE CROSS/BLUE SHIELD | Admitting: Internal Medicine

## 2016-07-13 ENCOUNTER — Other Ambulatory Visit (INDEPENDENT_AMBULATORY_CARE_PROVIDER_SITE_OTHER): Payer: BLUE CROSS/BLUE SHIELD

## 2016-07-13 VITALS — BP 110/70 | HR 62 | Wt 215.0 lb

## 2016-07-13 DIAGNOSIS — E890 Postprocedural hypothyroidism: Secondary | ICD-10-CM

## 2016-07-13 LAB — TSH: TSH: 2.58 u[IU]/mL (ref 0.35–4.50)

## 2016-07-13 LAB — T4, FREE: Free T4: 1.15 ng/dL (ref 0.60–1.60)

## 2016-07-13 MED ORDER — LEVOTHYROXINE SODIUM 150 MCG PO TABS: 150.0000 ug | ORAL_TABLET | Freq: Every day | ORAL | 3 refills | Status: DC

## 2016-07-13 NOTE — Patient Instructions (Signed)
Please continue Levothyroxine 150 mcg daily.  Take the thyroid hormone every day, with water, at least 30 minutes before breakfast, separated by at least 4 hours from: - acid reflux medications - calcium - iron - multivitamins  Stop at the lab.  Please return in 1 year.

## 2016-07-13 NOTE — Progress Notes (Signed)
Patient ID: Mikayla Wheeler, female   DOB: [birth year], 56 y.o.   MRN: [id number]   HPI  Mikayla Wheeler is a 56 y.o.-year-old female, initially referred by her PCP, Noelle Redmon, PA, now returning for f/u for uncontrolled hypothyroidism. Last visit 1 year ago. 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  Reviewed history:  Pt. has been dx with hypothyroidism 1980s ([facility]), after ablation for hyperthyroidism.  She is on Levothyroxine (previously on Synthroid). She was on 300 mcg daily, but she was not taking it correctly. We separated levothyroxine from meals, advise her to take it with water, and not milk, and we moved omeprazole and multivitamins at least 4 hours after LT4. By making the above changes, we were able to decrease her levothyroxine down to 150 g daily, and subsequent TSH finally returned normal.  Pt is now taking her Levothyroxine: - in am - fasting - at least 30 min from b'fast - no Ca, Fe, MVI - + PPIs: Protonix - first dose >4h later - not on Biotin  I reviewed pt's thyroid tests: Lab Results  Component Value Date   TSH 4.00 08/24/2015   TSH 3.51 02/21/2015   TSH 4.91 (H) 12/28/2014   TSH 2.76 10/27/2014  07/28/2012: TSH 0.07 (0.34-4.5) 01/29/2013: TSH 15.96 06/02/2013: TSH 14.18 07/29/2013: TSH 8.06 10/15/2013: TSH 7.03  12/22/2013: TSH 6.94 01/28/2014: TSH 0.17 02/25/2014: TSH 0.33 07/09/2014: TSH 0.27  Pt denies: - feeling nodules in neck - hoarseness - dysphagia - choking - SOB with lying down  She has no FH of thyroid disorders. No FH of thyroid cancer. No h/o radiation tx to head or neck.  No seaweed or kelp. No recent contrast studies. No herbal supplements. No Biotin use. No recent steroids use.   ROS: Constitutional: no weight gain/no weight loss, no fatigue, + subjective hyperthermia, no subjective hypothermia Eyes: no blurry vision, no xerophthalmia ENT: no sore throat, + see HPI Cardiovascular: no CP/no SOB/no  palpitations/no leg swelling Respiratory: no cough/no SOB/no wheezing Gastrointestinal: no N/no V/no D/no C/no acid reflux Musculoskeletal: no muscle aches/no joint aches Skin: no rashes, + hair loss Neurological: + tremors/no numbness/no tingling/no dizziness, + HA  I reviewed pt's medications, allergies, PMH, social hx, family hx, and changes were documented in the history of present illness. Otherwise, unchanged from my initial visit note.   Past Medical History:  Diagnosis Date  . Anemia    iron  . Anxiety   . Depression   . GERD (gastroesophageal reflux disease)    instructed to take prilosec dos  . Headache(784.0)   . Hypercholesteremia    simvastatin  . Hypothyroidism    synthroid  . PONV (postoperative nausea and vomiting)    Past Surgical History:  Procedure Laterality Date  . ABDOMINAL HYSTERECTOMY  09/18/2010   Procedure: HYSTERECTOMY ABDOMINAL;  Surgeon: Avel Sensor, MD;  Location: [facility];  Service: Gynecology;  Laterality: Bilateral;  Bilateral Salpingo Oophorectomy; with Lysis of Adhesions  . COLONOSCOPY    . HEMORROIDECTOMY  2007  . right breast lumpectomy  1998   Social History   Social History  . Marital Status: Married    Spouse Name: N/A  . Number of Children: 1 stepdaughter   Occupational History  . Truck driver, long distance   Social History Main Topics  . Smoking status: Never Smoker   . Smokeless tobacco: Never Used  . Alcohol Use: 1.2 oz/week    2 Cans of beer per week  Comment: weekends   . Drug Use: No   Current Outpatient Prescriptions on File Prior to Visit  Medication Sig Dispense Refill  . Ascorbic Acid (VITAMIN C PO) Take by mouth.    Marland Kitchen aspirin 325 MG tablet Take 81 mg by mouth daily. 1/2 TABLET DAILY     . buPROPion (WELLBUTRIN XL) 300 MG 24 hr tablet Take 300 mg by mouth daily.   0  . Cholecalciferol (VITAMIN D3) 2000 UNITS TABS Take 1 tablet by mouth daily.    . Esomeprazole Magnesium (NEXIUM PO) Take 44.6 mg by mouth  daily.    Marland Kitchen levothyroxine (SYNTHROID, LEVOTHROID) 150 MCG tablet take 1 tablet by mouth once daily 45 tablet 2  . meclizine (ANTIVERT) 25 MG tablet take 1 tablet by mouth twice a day if needed for dizziness 40 tablet 2  . Multiple Vitamins-Minerals (CENTRUM SILVER PO) Take 1 tablet by mouth daily.    . propranolol (INDERAL) 20 MG tablet Take 30 mg by mouth 2 (two) times daily.   0  . sertraline (ZOLOFT) 100 MG tablet Take 100 mg by mouth daily.      . simvastatin (ZOCOR) 40 MG tablet Take 40 mg by mouth at bedtime.      . vitamin B-12 (CYANOCOBALAMIN) 1000 MCG tablet Take 1,000 mcg by mouth daily.     No current facility-administered medications on file prior to visit.    Allergies  Allergen Reactions  . Amoxicillin Other (See Comments)    Nose bleed per patient  . Morphine And Related Hives  . Latex Rash   Family History  Problem Relation Age of Onset  . Cancer Mother   . Hypertension Father   . Heart disease Father   . Cancer Father   . Alzheimer's disease Father   . Cancer Brother    PE: BP 110/70 (BP Location: Left Arm, Patient Position: Sitting)   Pulse 62   Wt 215 lb (97.5 kg)   LMP 09/14/2010   SpO2 95%   BMI 36.90 kg/m  Body mass index is 36.9 kg/m. Wt Readings from Last 3 Encounters:  01/30/16 222 lb 9 oz (101 kg)  06/28/15 216 lb (98 kg)  12/28/14 219 lb (99.3 kg)   Constitutional: obese, in NAD Eyes: PERRLA, EOMI, no exophthalmos ENT: moist mucous membranes, no thyromegaly, no cervical lymphadenopathy Cardiovascular: RRR, No MRG, + LE edema, pitting Respiratory: CTA B Gastrointestinal: abdomen soft, NT, ND, BS+ Musculoskeletal: no deformities, strength intact in all 4 Skin: moist, warm, no rashes Neurological: + tremor with outstretched hands R>L, DTR normal in all 4   ASSESSMENT: 1. Post-ablative Hypothyroidism  PLAN:  1. Patient with long-standing hypothyroidism, on levothyroxine therapy, with abnormal thyroid tests 2/2 incorrect dosing of  Levothyroxine for many years, now normalized,  on 150 mcg LT4 daily.  - Latest TSH levels reviewed >> normal, and she feels better. No palpitations or mm pain. - latest thyroid labs reviewed with pt >> normal  - she continues on LT4 150 mcg daily - we discussed about taking the thyroid hormone every day, with water, >30 minutes before breakfast, separated by >4 hours from acid reflux medications, calcium, iron, multivitamins. Pt. is taking it correctly - will check thyroid tests today: TSH and fT4 - If labs are abnormal, she will need to return for repeat TFTs in 1.5 months - OTW, RTC in 1 year  Needs refills.  Component     Latest Ref Rng & Units 07/13/2016  T4,Free(Direct)     0.60 -  1.60 ng/dL 1.15  TSH     0.35 - 4.50 uIU/mL 2.58  Normal TFTs.

## 2016-07-16 ENCOUNTER — Telehealth: Payer: Self-pay

## 2016-07-16 NOTE — Telephone Encounter (Signed)
Called patient and gave lab results. Patient had no questions or concerns.  

## 2016-07-16 NOTE — Telephone Encounter (Signed)
-----   Message from [name], MD sent at 07/13/2016  4:22 PM EDT ----- Mikayla Wheeler, can you please call pt: TFTs normal.

## 2017-07-10 ENCOUNTER — Other Ambulatory Visit: Payer: Self-pay | Admitting: Internal Medicine

## 2017-07-12 ENCOUNTER — Ambulatory Visit: Payer: BLUE CROSS/BLUE SHIELD | Admitting: Internal Medicine

## 2017-10-25 ENCOUNTER — Ambulatory Visit: Payer: Self-pay | Admitting: Internal Medicine

## 2017-12-15 ENCOUNTER — Other Ambulatory Visit: Payer: Self-pay | Admitting: Internal Medicine

## 2017-12-16 NOTE — Telephone Encounter (Signed)
Let;s refill for 3 mo - needs appt before next refill

## 2017-12-16 NOTE — Telephone Encounter (Signed)
Last OV 07/13/16 refill or refuse please advise

## 2017-12-16 NOTE — Telephone Encounter (Signed)
LMTCB

## 2018-08-16 IMAGING — CR DG FOOT COMPLETE 3+V*L*
3 series · 3 of 3 positions shown · non-contrast
Comparison: None in PACs

CLINICAL DATA: Left heel pain for the past 6 months with no known
injury

EXAM:
LEFT FOOT - COMPLETE 3+ VIEW

[t foot ap left]
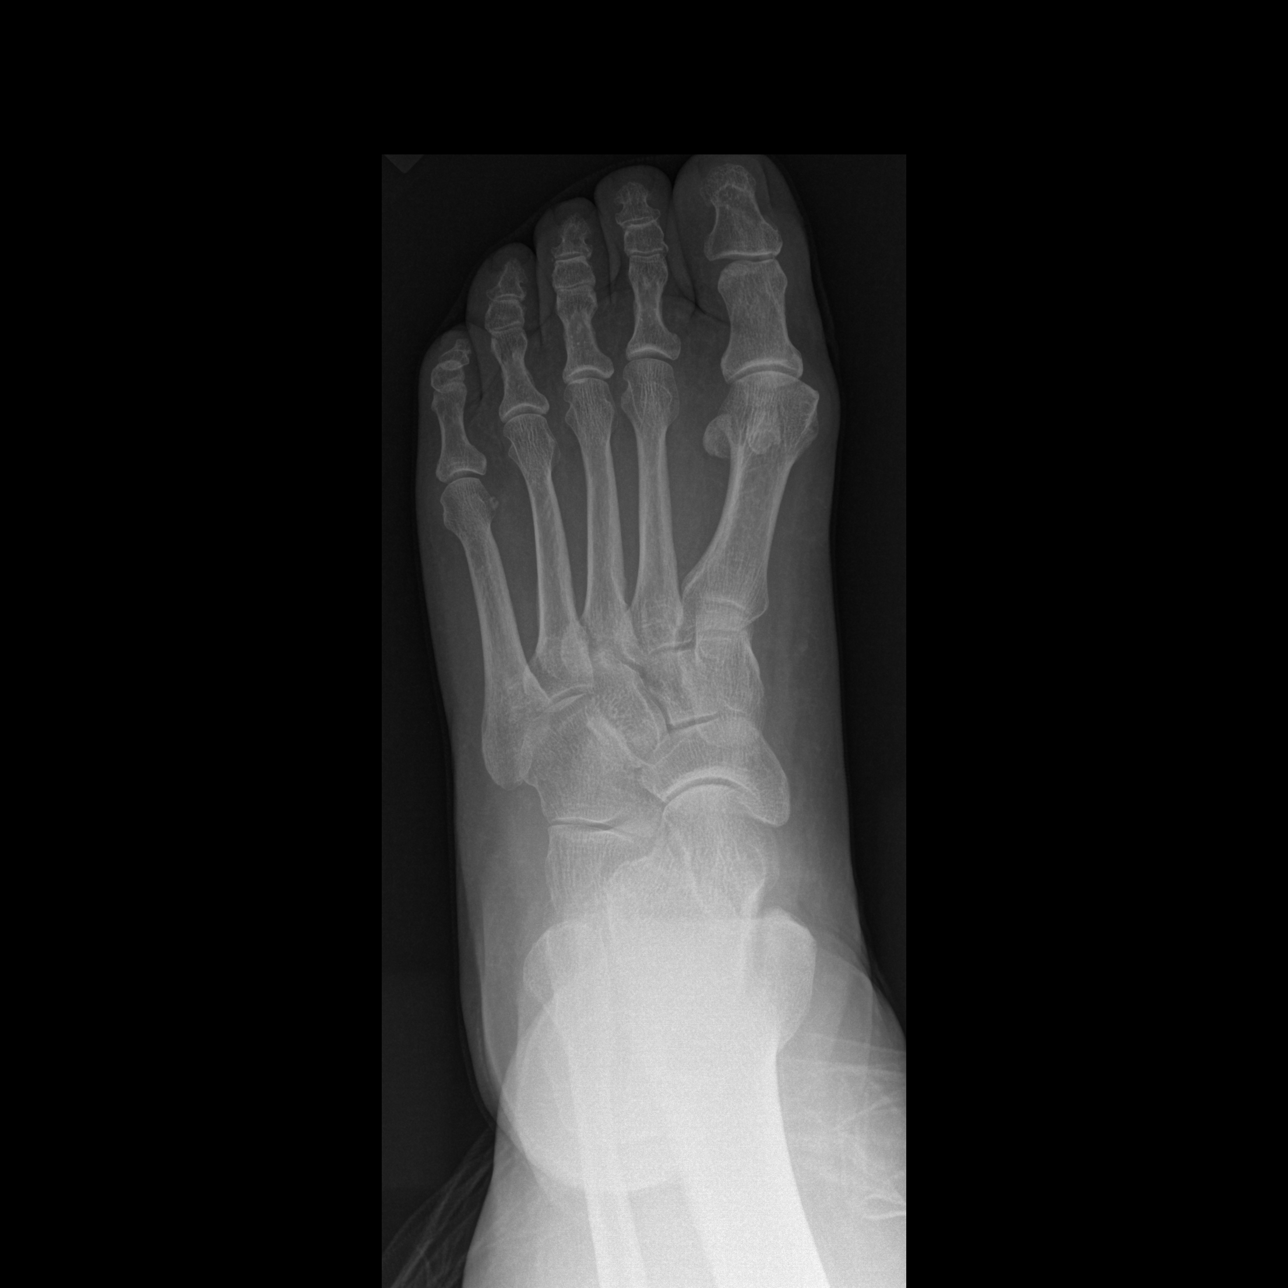

[t foot oblique left]
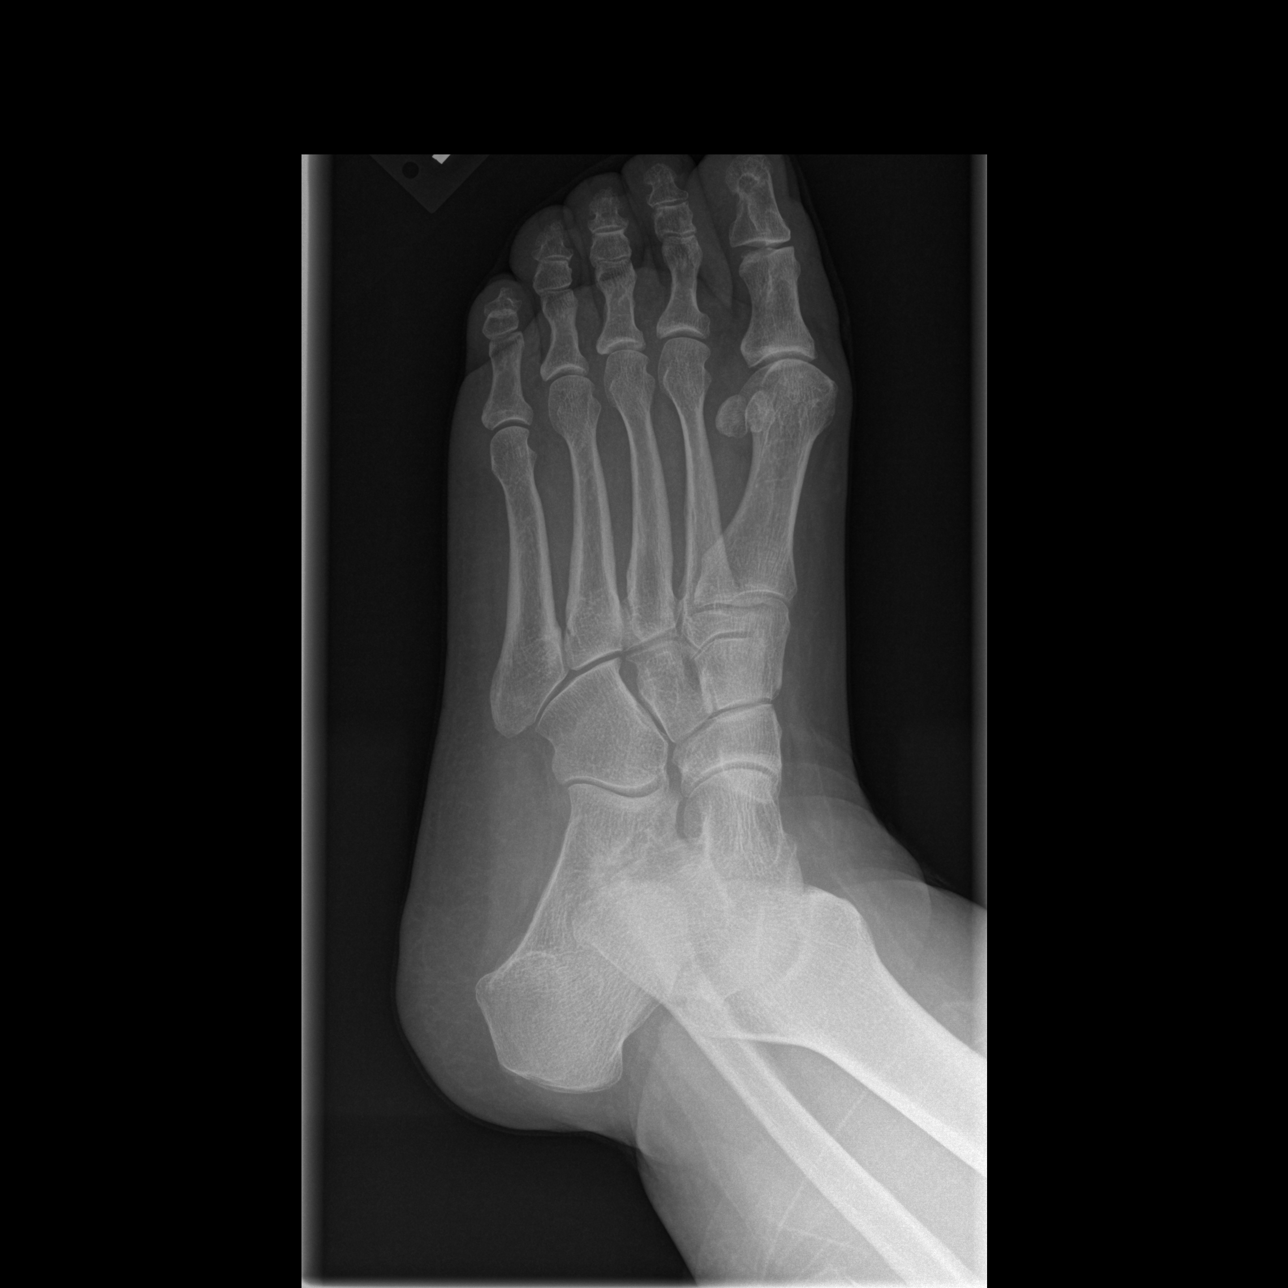

[t foot lat left]
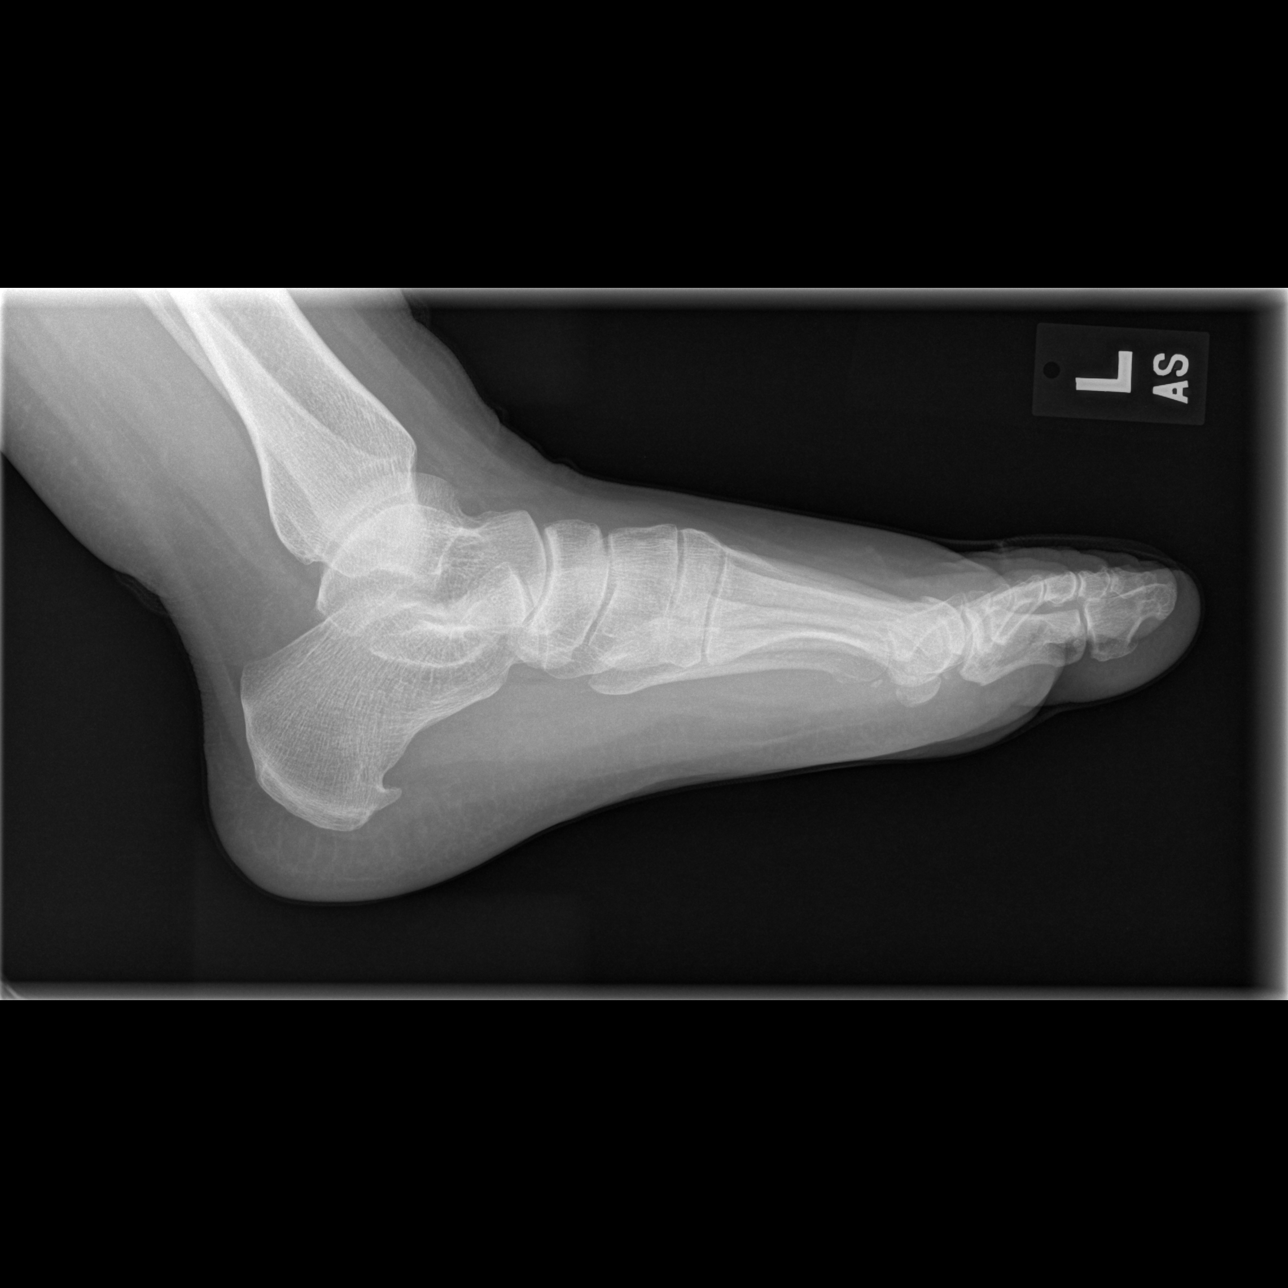

[3 of 3 positions shown; findings below may reference images not displayed]

FINDINGS: The bones are subjectively adequately mineralized. There is a
prominent plantar calcaneal spur. The soft tissues of the heel are
unremarkable. The phalanges, metatarsals, and tarsals exhibit no
acute abnormalities.
IMPRESSION: Prominent plantar calcaneal spur. No acute bony abnormality is
observed.
